# Patient Record
Sex: Male | Born: 1985 | Hispanic: Yes | Marital: Married | Smoking: Never smoker
Health system: Southern US, Community
[De-identification: ages and names within clinical notes are randomized; demographics above are authoritative.]

## PROBLEM LIST (undated history)

## (undated) DIAGNOSIS — Z789 Other specified health status: Secondary | ICD-10-CM

## (undated) HISTORY — PX: NO PAST SURGERIES: SHX2092

## (undated) HISTORY — PX: HERNIA REPAIR: SHX51

---

## 2016-03-02 ENCOUNTER — Emergency Department
Admission: EM | Admit: 2016-03-02 | Discharge: 2016-03-03 | Disposition: A | Discharge status: Home / Self Care | Payer: Self-pay | Attending: Student in an Organized Health Care Education/Training Program | Admitting: Student in an Organized Health Care Education/Training Program

## 2016-03-02 ENCOUNTER — Encounter: Payer: Self-pay | Admitting: Emergency Medicine

## 2016-03-02 DIAGNOSIS — R1033 Periumbilical pain: Secondary | ICD-10-CM | POA: Insufficient documentation

## 2016-03-02 DIAGNOSIS — K429 Umbilical hernia without obstruction or gangrene: Secondary | ICD-10-CM

## 2016-03-02 LAB — COMPREHENSIVE METABOLIC PANEL
ALBUMIN: 4.4 g/dL (ref 3.5–5.0)
ALK PHOS: 52 U/L (ref 38–126)
ALT: 34 U/L (ref 17–63)
ANION GAP: 9 (ref 5–15)
AST: 36 U/L (ref 15–41)
BUN: 14 mg/dL (ref 6–20)
CO2: 23 mmol/L (ref 22–32)
Calcium: 8.8 mg/dL — ABNORMAL LOW (ref 8.9–10.3)
Chloride: 103 mmol/L (ref 101–111)
Creatinine, Ser: 0.9 mg/dL (ref 0.61–1.24)
GFR calc Af Amer: >60 mL/min (ref >60)
GFR calc non Af Amer: >60 mL/min (ref >60)
GLUCOSE: 111 mg/dL — AB (ref 65–99)
POTASSIUM: 3.5 mmol/L (ref 3.5–5.1)
SODIUM: 135 mmol/L (ref 135–145)
TOTAL PROTEIN: 7.7 g/dL (ref 6.5–8.1)
Total Bilirubin: 1.7 mg/dL — ABNORMAL HIGH (ref 0.3–1.2)

## 2016-03-02 LAB — URINALYSIS, COMPLETE (UACMP) WITH MICROSCOPIC
BACTERIA UA: NONE SEEN
Bilirubin Urine: NEGATIVE
Glucose, UA: NEGATIVE mg/dL
HGB URINE DIPSTICK: NEGATIVE
Ketones, ur: NEGATIVE mg/dL
Leukocytes, UA: NEGATIVE
Nitrite: NEGATIVE
PROTEIN: NEGATIVE mg/dL
SPECIFIC GRAVITY, URINE: 1.029 (ref 1.005–1.030)
pH: 5 (ref 5.0–8.0)

## 2016-03-02 LAB — LIPASE, BLOOD: Lipase: 24 U/L (ref 11–51)

## 2016-03-02 LAB — CBC
HEMATOCRIT: 45.8 % (ref 40.0–52.0)
HEMOGLOBIN: 15.7 g/dL (ref 13.0–18.0)
MCH: 29.8 pg (ref 26.0–34.0)
MCHC: 34.2 g/dL (ref 32.0–36.0)
MCV: 87.1 fL (ref 80.0–100.0)
Platelets: 218 10*3/uL (ref 150–440)
RBC: 5.25 MIL/uL (ref 4.40–5.90)
RDW: 13.3 % (ref 11.5–14.5)
WBC: 8.2 10*3/uL (ref 3.8–10.6)

## 2016-03-02 MED ORDER — SODIUM CHLORIDE 0.9 % IV BOLUS (SEPSIS)
1000.0000 mL | Freq: Once | INTRAVENOUS | Status: AC
  Administered 2016-03-03: 1000 mL via INTRAVENOUS

## 2016-03-02 MED ORDER — ONDANSETRON 4 MG PO TBDP
4.0000 mg | ORAL_TABLET | Freq: Once | ORAL | Status: AC | PRN
  Administered 2016-03-02: 4 mg via ORAL

## 2016-03-02 MED ORDER — HYDROMORPHONE HCL 1 MG/ML IJ SOLN
1.0000 mg | INTRAMUSCULAR | Status: DC | PRN
  Administered 2016-03-03: 1 mg via INTRAVENOUS
  Filled 2016-03-02: qty 1

## 2016-03-02 MED ORDER — ONDANSETRON 4 MG PO TBDP
ORAL_TABLET | ORAL | Status: AC
  Filled 2016-03-02: qty 1

## 2016-03-02 NOTE — ED Notes (Signed)
Report to noel, rn.  

## 2016-03-02 NOTE — ED Provider Notes (Signed)
[facility] Emergency Department Provider Note    First MD Initiated Contact with Patient 03/02/16 2258     (approximate)  I have reviewed the triage vital signs and the nursing notes.   HISTORY  Chief Complaint Abdominal Pain    HPI Alexis Walter is a 31 y.o. male who presents with acute periumbilical pain that occurred after the patient was lifting 150 pound box while at work. Patient was straining to lift and felt sudden tearing popping sensation in his umbilicus.  This morning the patient had nausea and one episode of vomiting.  Has had gnawing pain throughout the day. Otherwise healthy. Does not take any blood thinners.   History reviewed. No pertinent past medical history. FMH: no blood thinner History reviewed. No pertinent surgical history. There are no active problems to display for this patient.     Prior to Admission medications   Not on File    Allergies Patient has no known allergies.    Social History Social History  Substance Use Topics  . Smoking status: Never Smoker  . Smokeless tobacco: Never Used  . Alcohol use No    Review of Systems Patient denies headaches, rhinorrhea, blurry vision, numbness, shortness of breath, chest pain, edema, cough, abdominal pain, nausea, vomiting, diarrhea, dysuria, fevers, rashes or hallucinations unless otherwise stated above in HPI. ____________________________________________   PHYSICAL EXAM:  VITAL SIGNS: Vitals:   03/02/16 2137 03/02/16 2248  BP: 133/78 138/72  Pulse: 99 95  Resp:  18  Temp: 98.7 F (37.1 C)     Constitutional: Alert and oriented. Well appearing and in no acute distress. Eyes: Conjunctivae are normal. PERRL. EOMI. Head: Atraumatic. Nose: No congestion/rhinnorhea. Mouth/Throat: Mucous membranes are moist.  Oropharynx non-erythematous. Neck: No stridor. Painless ROM. No cervical spine tenderness to palpation Hematological/Lymphatic/Immunilogical: No  cervical lymphadenopathy. Cardiovascular: Normal rate, regular rhythm. Grossly normal heart sounds.  Good peripheral circulation. Respiratory: Normal respiratory effort.  No retractions. Lungs CTAB. Gastrointestinal: Soft and there is a periumbilical hernia without overlying erythema  Ttp, + audible bowel sounds. No distention. No abdominal bruits. No CVA tenderness.  Musculoskeletal: No lower extremity tenderness nor edema.  No joint effusions. Neurologic:  Normal speech and language. No gross focal neurologic deficits are appreciated. No gait instability. Skin:  Skin is warm, dry and intact. No rash noted. Psychiatric: Mood and affect are normal. Speech and behavior are normal.  ____________________________________________   LABS (all labs ordered are listed, but only abnormal results are displayed)  Results for orders placed or performed during the hospital encounter of 03/02/16 (from the past 24 hour(s))  Lipase, blood     Status: None   Collection Time: 03/02/16  9:47 PM  Result Value Ref Range   Lipase 24 11 - 51 U/L  Comprehensive metabolic panel     Status: Abnormal   Collection Time: 03/02/16  9:47 PM  Result Value Ref Range   Sodium 135 135 - 145 mmol/L   Potassium 3.5 3.5 - 5.1 mmol/L   Chloride 103 101 - 111 mmol/L   CO2 23 22 - 32 mmol/L   Glucose, Bld 111 (H) 65 - 99 mg/dL   BUN 14 6 - 20 mg/dL   Creatinine, Ser 0.860.90 0.61 - 1.24 mg/dL   Calcium 8.8 (L) 8.9 - 10.3 mg/dL   Total Protein 7.7 6.5 - 8.1 g/dL   Albumin 4.4 3.5 - 5.0 g/dL   AST 36 15 - 41 U/L   ALT 34 17 - 63  U/L   Alkaline Phosphatase 52 38 - 126 U/L   Total Bilirubin 1.7 (H) 0.3 - 1.2 mg/dL   GFR calc non Af Amer >60 >60 mL/min   GFR calc Af Amer >60 >60 mL/min   Anion gap 9 5 - 15  CBC     Status: None   Collection Time: 03/02/16  9:47 PM  Result Value Ref Range   WBC 8.2 3.8 - 10.6 K/uL   RBC 5.25 4.40 - 5.90 MIL/uL   Hemoglobin 15.7 13.0 - 18.0 g/dL   HCT 45.4 09.8 - 11.9 %   MCV 87.1 80.0 -  100.0 fL   MCH 29.8 26.0 - 34.0 pg   MCHC 34.2 32.0 - 36.0 g/dL   RDW 14.7 82.9 - 56.2 %   Platelets 218 150 - 440 K/uL  Urinalysis, Complete w Microscopic     Status: Abnormal   Collection Time: 03/02/16  9:47 PM  Result Value Ref Range   Color, Urine YELLOW (A) YELLOW   APPearance CLEAR (A) CLEAR   Specific Gravity, Urine 1.029 1.005 - 1.030   pH 5.0 5.0 - 8.0   Glucose, UA NEGATIVE NEGATIVE mg/dL   Hgb urine dipstick NEGATIVE NEGATIVE   Bilirubin Urine NEGATIVE NEGATIVE   Ketones, ur NEGATIVE NEGATIVE mg/dL   Protein, ur NEGATIVE NEGATIVE mg/dL   Nitrite NEGATIVE NEGATIVE   Leukocytes, UA NEGATIVE NEGATIVE   RBC / HPF 0-5 0 - 5 RBC/hpf   WBC, UA 0-5 0 - 5 WBC/hpf   Bacteria, UA NONE SEEN NONE SEEN   Squamous Epithelial / LPF 0-5 (A) NONE SEEN   Mucous PRESENT    ____________________________________________  ____________________________________________  RADIOLOGY  I personally reviewed all radiographic images ordered to evaluate for the above acute complaints and reviewed radiology reports and findings.  These findings were personally discussed with the patient.  Please see medical record for radiology report.  ____________________________________________   PROCEDURES  Procedure(s) performed:  Procedures    Critical Care performed: no ____________________________________________   INITIAL IMPRESSION / ASSESSMENT AND PLAN / ED COURSE  Pertinent labs & imaging results that were available during my care of the patient were reviewed by me and considered in my medical decision making (see chart for details).  DDX: hernia, perf, obstruction msk strain  Alexis Walter is a 31 y.o. who presents to the ED with Abdominal pain as described above..  Patient is AFVSS in ED. Exam as above. Given current presentation have considered the above differential.  Patient provided pain medication and has evidence of periumbilical hernia. No diffuse peritonitis. I was unable to  successfully reduce the hernia based on his complaint of nausea with vomiting do feel that it is prudent to obtain CT imaging to evaluate for any obstructive process. The patient will be placed on continuous pulse oximetry and telemetry for monitoring.  Laboratory evaluation will be sent to evaluate for the above complaints.     Clinical Course as of Mar 04 343  Sat Mar 03, 2016  0120 Discussed results of CT scan with the patient. Discussed implications of periumbilical hernia and expectant management. We'll provide referral for outpatient surgery clinic as well as symptomatic management. Discussed signs and symptoms for which the patient should return to the ER.  Patient was able to tolerate PO and was able to ambulate with a steady gait.  Have discussed with the patient and available family all diagnostics and treatments performed thus far and all questions were answered to the best of  my ability. The patient demonstrates understanding and agreement with plan.   [PR]    Clinical Course User Index [PR] Willy Eddy, MD     ____________________________________________   FINAL CLINICAL IMPRESSION(S) / ED DIAGNOSES  Final diagnoses:  Periumbilical hernia  Periumbilical abdominal pain      NEW MEDICATIONS STARTED DURING THIS VISIT:  New Prescriptions   No medications on file     Note:  This document was prepared using Dragon voice recognition software and may include unintentional dictation errors.    Willy Eddy, MD 03/03/16 [phone]

## 2016-03-02 NOTE — ED Triage Notes (Signed)
Pt is ambulatory to triage with c/o lower abdominal pain. Pt states that he was lifting paint buckets out of his trunk when he felt some pain. Pt states that the pain really started this morning and that he believes that he has a hernia. Pt has swollen area to umbilicus. Per pt's spouse the area has been there for a while but is swelling more at this time. Pt is in NAD at this time.

## 2016-03-02 NOTE — ED Notes (Signed)
Pt with area noted to umbilicus that is protruding. Pt states began yesterday after lifting heavy 2 gallon paint buckets. Pt states pain does radiate to his back and did have 4 episodes of emesis since pain began. Pt states has had diarrhea but no fever. Pt is tender with periumbilicar pressure.

## 2016-03-03 ENCOUNTER — Emergency Department: Payer: Self-pay

## 2016-03-03 ENCOUNTER — Encounter: Payer: Self-pay | Admitting: Radiology

## 2016-03-03 MED ORDER — IOPAMIDOL (ISOVUE-300) INJECTION 61%
100.0000 mL | Freq: Once | INTRAVENOUS | Status: AC | PRN
  Administered 2016-03-03: 100 mL via INTRAVENOUS

## 2016-03-03 MED ORDER — HYDROCODONE-ACETAMINOPHEN 5-325 MG PO TABS: 1.0000 | ORAL_TABLET | ORAL | 0 refills | Status: DC | PRN

## 2016-03-03 MED ORDER — NAPROXEN 500 MG PO TABS: 500.0000 mg | ORAL_TABLET | Freq: Two times a day (BID) | ORAL | 0 refills | Status: DC

## 2016-03-03 MED ORDER — PROMETHAZINE HCL 12.5 MG PO TABS: 12.5000 mg | ORAL_TABLET | Freq: Four times a day (QID) | ORAL | 0 refills | Status: DC | PRN

## 2016-03-03 MED ORDER — POLYETHYLENE GLYCOL 3350 17 G PO PACK: 17.0000 g | PACK | Freq: Every day | ORAL | 0 refills | Status: DC

## 2016-03-03 NOTE — ED Notes (Signed)
Patient transported to CT 

## 2016-03-12 ENCOUNTER — Encounter: Payer: Self-pay | Admitting: General Surgery

## 2016-03-12 ENCOUNTER — Ambulatory Visit (INDEPENDENT_AMBULATORY_CARE_PROVIDER_SITE_OTHER): Payer: Self-pay | Admitting: General Surgery

## 2016-03-12 VITALS — BP 108/69 | HR 66 | Temp 98.2°F | Ht 65.0 in | Wt 192.0 lb

## 2016-03-12 DIAGNOSIS — K429 Umbilical hernia without obstruction or gangrene: Secondary | ICD-10-CM

## 2016-03-12 NOTE — Progress Notes (Signed)
Patient ID: Alexis Walter, male   DOB: 05/02/1985, 30 y.o.   MRN: [id number]  CC: Umbilical Hernia  HPI Alexis Walter is a 30 y.o. male who presents to clinic for evaluation of an umbilical hernia. Patient reports that he noticed something pop out through the Foley button that was very painful about 10 days ago. Due to the pain he reported to the emergency department for workup and was found to have an umbilical hernia. He was provided with pain medication and it has since improved with pain. He has constipation secondary to pain medication. He had nausea and vomiting prior to going to the ER but none since. He denies any fevers, chills, chest pain, shortness of breath.  HPI  No past medical history on file. No active medications prior to this event  No past surgical history on file. Patient is never had any surgery  No family history on file. Patient denies any family history of cancer, diabetes, heart disease  Social History Social History  Substance Use Topics  . Smoking status: Never Smoker  . Smokeless tobacco: Never Used  . Alcohol use No    No Known Allergies  Current Outpatient Prescriptions  Medication Sig Dispense Refill  . HYDROcodone-acetaminophen (NORCO) 5-325 MG tablet Take 1 tablet by mouth every 4 (four) hours as needed for moderate pain. 6 tablet 0  . naproxen (NAPROSYN) 500 MG tablet Take 1 tablet (500 mg total) by mouth 2 (two) times daily with a meal. 20 tablet 0  . polyethylene glycol (MIRALAX / GLYCOLAX) packet Take 17 g by mouth daily. Mix one tablespoon with 8oz of your favorite juice or water every day until you are having soft formed stools. Then start taking once daily if you didn't have a stool the day before. 30 each 0   No current facility-administered medications for this visit.      Review of Systems A Multi-point review of systems was asked and was negative except for the findings documented in the history of present illness  Physical  Exam Blood pressure 108/69, pulse 66, temperature 98.2 F (36.8 C), temperature source Oral, height 5' 5" (1.651 m), weight 87.1 kg (192 lb). CONSTITUTIONAL: No acute distress. EYES: Pupils are equal, round, and reactive to light, Sclera are non-icteric. EARS, NOSE, MOUTH AND THROAT: The oropharynx is clear. The oral mucosa is pink and moist. Hearing is intact to voice. LYMPH NODES:  Lymph nodes in the neck are normal. RESPIRATORY:  Lungs are clear. There is normal respiratory effort, with equal breath sounds bilaterally, and without pathologic use of accessory muscles. CARDIOVASCULAR: Heart is regular without murmurs, gallops, or rubs. GI: The abdomen is soft, nontender, and nondistended. There is an obvious umbilical hernia that is easily reducible on exam. There is no hepatosplenomegaly. There are normal bowel sounds in all quadrants. GU: Rectal deferred.   MUSCULOSKELETAL: Normal muscle strength and tone. No cyanosis or edema.   SKIN: Turgor is good and there are no pathologic skin lesions or ulcers. NEUROLOGIC: Motor and sensation is grossly normal. Cranial nerves are grossly intact. PSYCH:  Oriented to person, place and time. Affect is normal.  Data Reviewed Recent ER visit labs are all within normal limits, CT scan of the abdomen shows the small periumbilical hernia I have personally reviewed the patient's imaging, laboratory findings and medical records.    Assessment    Umbilical hernia    Plan    30-year-old male with an umbilical hernia. Described the diagnosis as well as   the surgical options in detail to include the risks, benefits, alternatives. The surgery described laparoscopic versus open repair. After this conversation patient elects to proceed with an open hernia repair. The signs and symptoms of incarceration were described in detail after the patient to return to the emergency room should it occur. Otherwise we will plan to proceed with surgery on March 1.     Time  spent with the patient was 45 minutes, with more than 50% of the time spent in face-to-face education, counseling and care coordination.     Shalah Estelle, MD FACS General Surgeon 03/12/2016, 3:50 PM    

## 2016-03-13 ENCOUNTER — Telehealth: Payer: Self-pay

## 2016-03-13 NOTE — Telephone Encounter (Signed)
At this time Patient is self pay and authorization is not necessary for CPT code [id number].

## 2016-03-13 NOTE — Telephone Encounter (Signed)
Patient has been advised of Surgery Date as well as Pre-Admission appointment date, time, and location.  Surgery Date: 03/29/16  Pre-admit Appointment: 03/22/16 at 1030am- Office  Patient has been advised to call [phone] the day before surgery between 1-3pm to obtain arrival time.

## 2016-03-22 ENCOUNTER — Inpatient Hospital Stay: Admission: RE | Admit: 2016-03-22 | Payer: Self-pay | Source: Ambulatory Visit

## 2016-03-22 ENCOUNTER — Telehealth: Payer: Self-pay

## 2016-03-22 NOTE — Telephone Encounter (Signed)
Patient had an appointment no showed to his appointment for pre admit. He is scheduled for an Umbilical Hernia Repair on 03/29/2016 with Dr. Tonita CongWoodham.

## 2016-03-23 ENCOUNTER — Telehealth: Payer: Self-pay

## 2016-03-23 NOTE — Telephone Encounter (Signed)
Rescheduled Pre-admission appointment is: 03/26/16 at 1130am.

## 2016-03-23 NOTE — Telephone Encounter (Signed)
Called patient but his cell phone was not set-up to leave a voicemail. I then called his wife's Byrd Hesselbach(Maria) cell phone and gave her the pre-admit appointment. However, she stated that it was not going to be possible for him to go to his pre-admit appointment. Therefore, I gave Byrd HesselbachMaria pre-admit's number [phone] for her to call and reschedule. Byrd HesselbachMaria understood and had no further questions.

## 2016-03-26 ENCOUNTER — Inpatient Hospital Stay: Admission: RE | Admit: 2016-03-26 | Payer: Self-pay | Source: Ambulatory Visit

## 2016-03-28 ENCOUNTER — Encounter
Admission: RE | Admit: 2016-03-28 | Discharge: 2016-03-28 | Disposition: A | Discharge status: Home / Self Care | Payer: Self-pay | Source: Ambulatory Visit | Attending: General Surgery | Admitting: General Surgery

## 2016-03-28 DIAGNOSIS — Z01818 Encounter for other preprocedural examination: Secondary | ICD-10-CM | POA: Insufficient documentation

## 2016-03-28 HISTORY — DX: Other specified health status: Z78.9

## 2016-03-28 NOTE — Patient Instructions (Addendum)
  Your procedure is scheduled on: March 29, 2016 (Thursday)  (Report to Same Day Surgery 2nd floor medical mall [facility](Medical Mall Entrance-take elevator on left to 2nd floor.  Check in with surgery information desk.) ARRIVAL TIME 6:15 A. M.  Remember: Instructions that are not followed completely may result in serious medical risk, up to and including death, or upon the discretion of your surgeon and anesthesiologist your surgery may need to be rescheduled.    _x___ 1. Do not eat food or drink liquids after midnight. No gum chewing or hard candies.     __x__ 2. No Alcohol for 24 hours before or after surgery.   __x__3. No Smoking for 24 prior to surgery.   ____  4. Bring all medications with you on the day of surgery if instructed.    __x__ 5. Notify your doctor if there is any change in your medical condition     (cold, fever, infections).     Do not wear jewelry, make-up, hairpins, clips or nail polish.  Do not wear lotions, powders, or perfumes. You may wear deodorant.  Do not shave 48 hours prior to surgery. Men may shave face and neck.  Do not bring valuables to the hospital.    [facility] is not responsible for any belongings or valuables.               Contacts, dentures or bridgework may not be worn into surgery.  Leave your suitcase in the car. After surgery it may be brought to your room.  For patients admitted to the hospital, discharge time is determined by your treatment team.   Patients discharged the day of surgery will not be allowed to drive home.  You will need someone to drive you home and stay with you the night of your procedure.    Please read over the following fact sheets that you were given:   [facility] Preparing for Surgery and or MRSA Information   __ Take these medicines the morning of surgery with A SIP OF WATER:    1.   2.  3.  4.  5.  6.  ____Fleets enema or Magnesium Citrate as directed.   _x___ Use CHG Soap or sage wipes as directed on  instruction sheet   ____ Use inhalers on the day of surgery and bring to hospital day of surgery  ____ Stop metformin 2 days prior to surgery    ____ Take 1/2 of usual insulin dose the night before surgery and none on the morning of           surgery.   ____ Stop Aspirin, Coumadin, Pllavix ,Eliquis, Effient, or Pradaxa  x__ Stop Anti-inflammatories such as Advil, Aleve, Ibuprofen, Motrin, Naproxen,          Naprosyn, Goodies powders or aspirin products. Ok to take Tylenol.   ____ Stop supplements until after surgery.    ____ Bring C-Pap to the hospital.

## 2016-03-29 ENCOUNTER — Encounter: Payer: Self-pay | Admitting: Anesthesiology

## 2016-03-29 ENCOUNTER — Ambulatory Visit: Payer: Self-pay | Admitting: Anesthesiology

## 2016-03-29 ENCOUNTER — Encounter
Admission: RE | Disposition: A | Discharge status: Home / Self Care | Payer: Self-pay | Source: Ambulatory Visit | Attending: General Surgery

## 2016-03-29 ENCOUNTER — Ambulatory Visit
Admission: RE | Admit: 2016-03-29 | Discharge: 2016-03-29 | Disposition: A | Discharge status: Home / Self Care | Payer: Self-pay | Source: Ambulatory Visit | Attending: General Surgery | Admitting: General Surgery

## 2016-03-29 DIAGNOSIS — K5900 Constipation, unspecified: Secondary | ICD-10-CM | POA: Insufficient documentation

## 2016-03-29 DIAGNOSIS — K429 Umbilical hernia without obstruction or gangrene: Secondary | ICD-10-CM | POA: Insufficient documentation

## 2016-03-29 HISTORY — PX: UMBILICAL HERNIA REPAIR: SHX196

## 2016-03-29 SURGERY — REPAIR, HERNIA, UMBILICAL, ADULT
Anesthesia: General | Wound class: Clean

## 2016-03-29 MED ORDER — SODIUM CHLORIDE 0.9 % IV SOLN
INTRAVENOUS | Status: DC | PRN
  Administered 2016-03-29: 40 mL

## 2016-03-29 MED ORDER — MIDAZOLAM HCL 2 MG/2ML IJ SOLN
INTRAMUSCULAR | Status: DC | PRN
  Administered 2016-03-29: 2 mg via INTRAVENOUS

## 2016-03-29 MED ORDER — SUCCINYLCHOLINE CHLORIDE 20 MG/ML IJ SOLN
INTRAMUSCULAR | Status: AC
  Filled 2016-03-29: qty 1

## 2016-03-29 MED ORDER — HYDROCODONE-ACETAMINOPHEN 5-325 MG PO TABS
ORAL_TABLET | ORAL | Status: AC
  Filled 2016-03-29: qty 1

## 2016-03-29 MED ORDER — LIDOCAINE HCL (PF) 1 % IJ SOLN
INTRAMUSCULAR | Status: AC
  Filled 2016-03-29: qty 30

## 2016-03-29 MED ORDER — OXYCODONE HCL 5 MG PO TABS: 5.0000 mg | ORAL_TABLET | Freq: Once | ORAL | Status: DC | PRN

## 2016-03-29 MED ORDER — ONDANSETRON HCL 4 MG/2ML IJ SOLN
INTRAMUSCULAR | Status: AC
  Filled 2016-03-29: qty 2

## 2016-03-29 MED ORDER — SUGAMMADEX SODIUM 200 MG/2ML IV SOLN
INTRAVENOUS | Status: AC
  Filled 2016-03-29: qty 2

## 2016-03-29 MED ORDER — SODIUM CHLORIDE 0.9 % IJ SOLN
INTRAMUSCULAR | Status: AC
  Filled 2016-03-29: qty 50

## 2016-03-29 MED ORDER — MIDAZOLAM HCL 2 MG/2ML IJ SOLN
INTRAMUSCULAR | Status: AC
  Filled 2016-03-29: qty 2

## 2016-03-29 MED ORDER — FENTANYL CITRATE (PF) 100 MCG/2ML IJ SOLN
INTRAMUSCULAR | Status: DC | PRN
  Administered 2016-03-29: 50 ug via INTRAVENOUS
  Administered 2016-03-29: 100 ug via INTRAVENOUS
  Administered 2016-03-29: 25 ug via INTRAVENOUS

## 2016-03-29 MED ORDER — FENTANYL CITRATE (PF) 100 MCG/2ML IJ SOLN
25.0000 ug | INTRAMUSCULAR | Status: DC | PRN
  Administered 2016-03-29 (×4): 25 ug via INTRAVENOUS

## 2016-03-29 MED ORDER — CHLORHEXIDINE GLUCONATE CLOTH 2 % EX PADS: 6.0000 | MEDICATED_PAD | Freq: Once | CUTANEOUS | Status: DC

## 2016-03-29 MED ORDER — CEFAZOLIN SODIUM-DEXTROSE 2-4 GM/100ML-% IV SOLN
2.0000 g | INTRAVENOUS | Status: AC
  Administered 2016-03-29: 2 g via INTRAVENOUS

## 2016-03-29 MED ORDER — HYDROCODONE-ACETAMINOPHEN 5-325 MG PO TABS
1.0000 | ORAL_TABLET | ORAL | Status: DC | PRN
  Administered 2016-03-29: 1 via ORAL

## 2016-03-29 MED ORDER — SUCCINYLCHOLINE CHLORIDE 20 MG/ML IJ SOLN
INTRAMUSCULAR | Status: DC | PRN
  Administered 2016-03-29: 100 mg via INTRAVENOUS

## 2016-03-29 MED ORDER — ONDANSETRON HCL 4 MG/2ML IJ SOLN
INTRAMUSCULAR | Status: DC | PRN
  Administered 2016-03-29: 4 mg via INTRAVENOUS

## 2016-03-29 MED ORDER — SUGAMMADEX SODIUM 200 MG/2ML IV SOLN
INTRAVENOUS | Status: DC | PRN
  Administered 2016-03-29: 180 mg via INTRAVENOUS

## 2016-03-29 MED ORDER — FENTANYL CITRATE (PF) 100 MCG/2ML IJ SOLN
INTRAMUSCULAR | Status: AC
  Filled 2016-03-29: qty 2

## 2016-03-29 MED ORDER — ROCURONIUM BROMIDE 50 MG/5ML IV SOLN
INTRAVENOUS | Status: AC
  Filled 2016-03-29: qty 1

## 2016-03-29 MED ORDER — LIDOCAINE HCL (CARDIAC) 20 MG/ML IV SOLN
INTRAVENOUS | Status: DC | PRN
  Administered 2016-03-29: 100 mg via INTRAVENOUS

## 2016-03-29 MED ORDER — OXYCODONE HCL 5 MG/5ML PO SOLN: 5.0000 mg | Freq: Once | ORAL | Status: DC | PRN

## 2016-03-29 MED ORDER — FAMOTIDINE 20 MG PO TABS
ORAL_TABLET | ORAL | Status: AC
  Administered 2016-03-29: 20 mg via ORAL
  Filled 2016-03-29: qty 1

## 2016-03-29 MED ORDER — LIDOCAINE HCL (PF) 1 % IJ SOLN
INTRAMUSCULAR | Status: DC | PRN
  Administered 2016-03-29: 10 mL

## 2016-03-29 MED ORDER — CEFAZOLIN SODIUM-DEXTROSE 2-4 GM/100ML-% IV SOLN
INTRAVENOUS | Status: AC
  Filled 2016-03-29: qty 100

## 2016-03-29 MED ORDER — LACTATED RINGERS IV SOLN
INTRAVENOUS | Status: DC
  Administered 2016-03-29: 07:00:00 via INTRAVENOUS

## 2016-03-29 MED ORDER — BUPIVACAINE LIPOSOME 1.3 % IJ SUSP
INTRAMUSCULAR | Status: AC
  Filled 2016-03-29: qty 20

## 2016-03-29 MED ORDER — PROPOFOL 10 MG/ML IV BOLUS
INTRAVENOUS | Status: AC
  Filled 2016-03-29: qty 20

## 2016-03-29 MED ORDER — HYDROCODONE-ACETAMINOPHEN 5-325 MG PO TABS: 1.0000 | ORAL_TABLET | ORAL | 0 refills | Status: DC | PRN

## 2016-03-29 MED ORDER — ROCURONIUM BROMIDE 100 MG/10ML IV SOLN
INTRAVENOUS | Status: DC | PRN
  Administered 2016-03-29: 5 mg via INTRAVENOUS

## 2016-03-29 MED ORDER — LIDOCAINE HCL (PF) 2 % IJ SOLN
INTRAMUSCULAR | Status: AC
  Filled 2016-03-29: qty 2

## 2016-03-29 MED ORDER — PROPOFOL 10 MG/ML IV BOLUS
INTRAVENOUS | Status: DC | PRN
  Administered 2016-03-29: 170 mg via INTRAVENOUS

## 2016-03-29 MED ORDER — FAMOTIDINE 20 MG PO TABS
20.0000 mg | ORAL_TABLET | Freq: Once | ORAL | Status: AC
  Administered 2016-03-29: 20 mg via ORAL

## 2016-03-29 MED ORDER — FENTANYL CITRATE (PF) 100 MCG/2ML IJ SOLN
INTRAMUSCULAR | Status: AC
  Administered 2016-03-29: 25 ug via INTRAVENOUS
  Filled 2016-03-29: qty 2

## 2016-03-29 SURGICAL SUPPLY — 31 items
BLADE SURG 15 STRL LF DISP TIS (BLADE) ×1 IMPLANT
BLADE SURG 15 STRL SS (BLADE) ×2
CANISTER SUCT 1200ML W/VALVE (MISCELLANEOUS) ×3 IMPLANT
CHLORAPREP W/TINT 26ML (MISCELLANEOUS) ×3 IMPLANT
COTTON BALL STRL MEDIUM (GAUZE/BANDAGES/DRESSINGS) ×3 IMPLANT
DERMABOND ADVANCED (GAUZE/BANDAGES/DRESSINGS) ×2
DERMABOND ADVANCED .7 DNX12 (GAUZE/BANDAGES/DRESSINGS) ×1 IMPLANT
DRAPE LAPAROTOMY 100X77 ABD (DRAPES) ×3 IMPLANT
DRESSING TELFA 4X3 1S ST N-ADH (GAUZE/BANDAGES/DRESSINGS) ×3 IMPLANT
DRSG TEGADERM 4X4.75 (GAUZE/BANDAGES/DRESSINGS) ×3 IMPLANT
ELECT REM PT RETURN 9FT ADLT (ELECTROSURGICAL) ×3
ELECTRODE REM PT RTRN 9FT ADLT (ELECTROSURGICAL) ×1 IMPLANT
GLOVE BIO SURGEON STRL SZ7.5 (GLOVE) ×15 IMPLANT
GLOVE INDICATOR 8.0 STRL GRN (GLOVE) ×3 IMPLANT
GOWN STRL REUS W/ TWL LRG LVL3 (GOWN DISPOSABLE) ×3 IMPLANT
GOWN STRL REUS W/TWL LRG LVL3 (GOWN DISPOSABLE) ×6
KIT RM TURNOVER STRD PROC AR (KITS) ×3 IMPLANT
LABEL OR SOLS (LABEL) ×3 IMPLANT
NEEDLE HYPO 25X1 1.5 SAFETY (NEEDLE) ×6 IMPLANT
NS IRRIG 500ML POUR BTL (IV SOLUTION) ×3 IMPLANT
PACK BASIN MINOR ARMC (MISCELLANEOUS) ×3 IMPLANT
SUT ETHIBOND 0 MO6 C/R (SUTURE) ×3 IMPLANT
SUT MNCRL 4-0 (SUTURE) ×2
SUT MNCRL 4-0 27XMFL (SUTURE) ×1
SUT VIC AB 2-0 CT1 27 (SUTURE) ×2
SUT VIC AB 2-0 CT1 TAPERPNT 27 (SUTURE) ×1 IMPLANT
SUT VIC AB 3-0 SH 27 (SUTURE) ×2
SUT VIC AB 3-0 SH 27X BRD (SUTURE) ×1 IMPLANT
SUTURE MNCRL 4-0 27XMF (SUTURE) ×1 IMPLANT
SYR 20CC LL (SYRINGE) ×3 IMPLANT
SYRINGE 10CC LL (SYRINGE) ×3 IMPLANT

## 2016-03-29 NOTE — Anesthesia Procedure Notes (Signed)
Procedure Name: Intubation Performed by: Ahlivia Salahuddin Pre-anesthesia Checklist: Patient identified, Patient being monitored, Timeout performed, Emergency Drugs available and Suction available Patient Re-evaluated:Patient Re-evaluated prior to inductionOxygen Delivery Method: Circle system utilized Preoxygenation: Pre-oxygenation with 100% oxygen Intubation Type: IV induction Ventilation: Mask ventilation without difficulty Laryngoscope Size: Mac and 3 Grade View: Grade I Tube type: Oral Tube size: 7.5 mm Number of attempts: 1 Airway Equipment and Method: Stylet Placement Confirmation: ETT inserted through vocal cords under direct vision,  positive ETCO2 and breath sounds checked- equal and bilateral Secured at: 22 cm Tube secured with: Tape Dental Injury: Teeth and Oropharynx as per pre-operative assessment        

## 2016-03-29 NOTE — Anesthesia Post-op Follow-up Note (Cosign Needed)
Anesthesia QCDR form completed.        

## 2016-03-29 NOTE — Discharge Instructions (Signed)
Reparacin abierta de una hernia, cuidados posteriores (Open Hernia Repair, Care After) Estas indicaciones le proporcionan informacin acerca de cmo deber cuidarse despus del procedimiento. El mdico tambin podr darle instrucciones especficas. Comunquese con el mdico si tiene algn problema o tiene preguntas despus del procedimiento. CUIDADOS EN EL HOGAR  Mantenga la zona del corte (incisin) limpia y [place]. Puede lavar la zona de la incisin suavemente con agua y jabn 48horas despus de la [place]. Para secar la zona, d palmaditas suaves.  No se d baos de inmersin, no practique natacin ni use el jacuzzi durante 10das o hasta que el mdico lo apruebe.  Cambie el vendaje (apsito) como se lo haya indicado el mdico.  Controle todos los das la zona de la incisin para detectar signos de infeccin. Est atento a lo siguiente:  Dolor, hinchazn o enrojecimiento.  Lquido, sangre o pus.  Coma mucha fruta y verdura fresca. Esto ayuda a Chief Strategy Officer.  Beba suficiente lquido para mantener el pis (orina) claro o de color amarillo plido. Esto tambin ayuda a Banker.  No conduzca ni opere maquinaria pesada hasta que el mdico lo autorice.  No levante ningn objeto que pese ms de 10libras (4,5kg) hasta que el mdico lo apruebe.  No practique deportes de contacto durante cuatro semanas o hasta que el mdico lo apruebe.  Tome los medicamentos solamente como se lo haya indicado el mdico.  Concurra a todas las visitas de control como se lo haya indicado el mdico. Esto es importante. Pregntele al mdico para cundo debe programar una cita para que le retiren los puntos (suturas) o las grapas. SOLICITE AYUDA SI:  La incisin est sangrando ms que antes.  Observa sangre en las heces (materia fecal).  La incisin duele ms que antes.  Tiene enrojecimiento, hinchazn o dolor en la zona de la incisin.  Observa lquido, sangre o pus que  emanan de la incisin.  Tiene fiebre.  Percibe que sale mal olor del rea de la incisin o del vendaje. SOLICITE AYUDA DE INMEDIATO SI:  Tiene una erupcin cutnea.  Le duele el pecho.  Comienza a sentir falta de aire.  Tiene sensacin de desvanecimiento.  Se siente dbil y mareado (sensacin de desvanecimiento). Esta informacin no tiene Theme park manager el consejo del mdico. Asegrese de hacerle al mdico cualquier pregunta que tenga. Document Released: 02/05/2014 Document Revised: 06/29/2015 Document Reviewed: 06/29/2015 Elsevier Interactive Patient Education  2017 Elsevier Inc. Open Hernia Repair, Adult, Care After These instructions give you information about caring for yourself after your procedure. Your doctor may also give you more specific instructions. If you have problems or questions, contact your doctor. Follow these instructions at home: Surgical cut (incision) care    Follow instructions from your doctor about how to take care of your surgical cut area. Make sure you:  Wash your hands with soap and water before you change your bandage (dressing). If you cannot use soap and water, use hand sanitizer.  Change your bandage as told by your doctor. Keep initial bandage in place for 72 hours. Remove and replace with large Band-Aid until there is no drainage on dressing. If cotton sticks to wound, cut the cotton, DO NOT pull glue away from skin.  Leave stitches (sutures), skin glue, or skin tape (adhesive) strips in place. They may need to stay in place for 2 weeks or longer. If tape strips get loose and curl up, you may trim the loose edges. Do not remove tape strips completely unless  your doctor says it is okay.  Check your surgical cut every day for signs of infection. Check for:  More redness, swelling, or pain.  More fluid or blood.  Warmth.  Pus or a bad smell. Activity   Do not drive or use heavy machinery while taking prescription pain medicine. Do not  drive until your doctor says it is okay.  Until your doctor says it is okay:  Do not lift anything that is heavier than 10 lb (4.5 kg).  Do not play contact sports.  Return to your normal activities as told by your doctor. Ask your doctor what activities are safe. General instructions   To prevent or treat having a hard time pooping (constipation) while you are taking prescription pain medicine, your doctor may recommend that you:  Drink enough fluid to keep your pee (urine) clear or pale yellow.  Take over-the-counter or prescription medicines.  Eat foods that are high in fiber, such as fresh fruits and vegetables, whole grains, and beans.  Limit foods that are high in fat and processed sugars, such as fried and sweet foods.  Take over-the-counter and prescription medicines only as told by your doctor.  Do not take baths, swim, or use a hot tub until your doctor says it is okay. OK to shower in 24 hours.  Keep all follow-up visits as told by your doctor. This is important. Contact a doctor if:  You develop a rash.  You have more redness, swelling, or pain around your surgical cut.  You have more fluid or blood coming from your surgical cut.  Your surgical cut feels warm to the touch.  You have pus or a bad smell coming from your surgical cut.  You have a fever or chills.  You have blood in your poop (stool).  You have not pooped in 2-3 days.  Medicine does not help your pain. Get help right away if:  You have chest pain or you are short of breath.  You feel light-headed.  You feel weak and dizzy (feel faint).  You have very bad pain.  You throw up (vomit) and your pain is worse. This information is not intended to replace advice given to you by your health care provider. Make sure you discuss any questions you have with your health care provider. Document Released: 02/05/2014 Document Revised: 08/05/2015 Document Reviewed: 06/29/2015 Elsevier Interactive  Patient Education  2017 [employer]       1.  Las drogas que se Dispensing opticianle administraron permaneceran en su cuerpo [employer]asta [place], asi      que por las proximas 24 horas usted no debe:   Biomedical scientistConducir (manejar) un automovil   Hacer ninguna decision legal   Tomar ninguna bebida alcoholica  2.  A) [place] puede comenzar una dieta regular.  Es mejor que hoy empiece con                    liquidos y gradualmente anada [address].       B) Puede comer cualquier comida que desee pero es mejor empezar con liquidos,               luego sopitas con galletas saladas y gradualmente llegar a las comidas solidas.  3.  Por favor avise a su medico inmediatamente si usted tiene algun sangrado anormal,       tiene dificultad con la respiracion, enrojecimiento y Engineer, miningdolor en el sitio de la [place],     drenaje, fiebro o dolor que  se alivia con medicina.   5.  Istrucciones especificas :

## 2016-03-29 NOTE — Anesthesia Preprocedure Evaluation (Addendum)
Anesthesia Evaluation  Patient identified by MRN, date of birth, ID band Patient awake    Reviewed: Allergy & Precautions, H&P , NPO status , Patient's Chart, lab work & pertinent test results  History of Anesthesia Complications Negative for: history of anesthetic complications  Airway Mallampati: II  TM Distance: >3 FB Neck ROM: full    Dental  (+) Poor Dentition, Chipped   Pulmonary neg pulmonary ROS, neg shortness of breath,    Pulmonary exam normal breath sounds clear to auscultation       Cardiovascular Exercise Tolerance: Good (-) angina(-) Past MI and (-) DOE negative cardio ROS Normal cardiovascular exam Rhythm:regular Rate:Normal     Neuro/Psych negative neurological ROS  negative psych ROS   GI/Hepatic negative GI ROS, Neg liver ROS, neg GERD  ,  Endo/Other  negative endocrine ROS  Renal/GU      Musculoskeletal   Abdominal   Peds  Hematology negative hematology ROS (+)   Anesthesia Other Findings Past Medical History: No date: Medical history non-contributory  Past Surgical History: No date: NO PAST SURGERIES  BMI    Body Mass Index:  29.65 kg/m      Reproductive/Obstetrics negative OB ROS                             Anesthesia Physical Anesthesia Plan  ASA: I  Anesthesia Plan: General ETT   Post-op Pain Management:    Induction:   Airway Management Planned:   Additional Equipment:   Intra-op Plan:   Post-operative Plan:   Informed Consent: I have reviewed the patients History and Physical, chart, labs and discussed the procedure including the risks, benefits and alternatives for the proposed anesthesia with the patient or authorized representative who has indicated his/her understanding and acceptance.   Dental Advisory Given  Plan Discussed with: Anesthesiologist, CRNA and Surgeon  Anesthesia Plan Comments: (Patient declines interpreter )        Anesthesia Quick Evaluation

## 2016-03-29 NOTE — H&P (View-Only) (Signed)
Patient ID: Alexis Walter, male   DOB: 10-31-1985, 31 y.o.   MRN: [id number]  CC: Umbilical Hernia  HPI Alexis Walter is a 31 y.o. male who presents to clinic for evaluation of an umbilical hernia. Patient reports that he noticed something pop out through the Foley button that was very painful about 10 days ago. Due to the pain he reported to the emergency department for workup and was found to have an umbilical hernia. He was provided with pain medication and it has since improved with pain. He has constipation secondary to pain medication. He had nausea and vomiting prior to going to the ER but none since. He denies any fevers, chills, chest pain, shortness of breath.  HPI  No past medical history on file. No active medications prior to this event  No past surgical history on file. Patient is never had any surgery  No family history on file. Patient denies any family history of cancer, diabetes, heart disease  Social History Social History  Substance Use Topics  . Smoking status: Never Smoker  . Smokeless tobacco: Never Used  . Alcohol use No    No Known Allergies  Current Outpatient Prescriptions  Medication Sig Dispense Refill  . HYDROcodone-acetaminophen (NORCO) 5-325 MG tablet Take 1 tablet by mouth every 4 (four) hours as needed for moderate pain. 6 tablet 0  . naproxen (NAPROSYN) 500 MG tablet Take 1 tablet (500 mg total) by mouth 2 (two) times daily with a meal. 20 tablet 0  . polyethylene glycol (MIRALAX / GLYCOLAX) packet Take 17 g by mouth daily. Mix one tablespoon with 8oz of your favorite juice or water every day until you are having soft formed stools. Then start taking once daily if you didn't have a stool the day before. 30 each 0   No current facility-administered medications for this visit.      Review of Systems A Multi-point review of systems was asked and was negative except for the findings documented in the history of present illness  Physical  Exam Blood pressure 108/69, pulse 66, temperature 98.2 F (36.8 C), temperature source Oral, height 5\' 5"  (1.651 m), weight 87.1 kg (192 lb). CONSTITUTIONAL: No acute distress. EYES: Pupils are equal, round, and reactive to light, Sclera are non-icteric. EARS, NOSE, MOUTH AND THROAT: The oropharynx is clear. The oral mucosa is pink and moist. Hearing is intact to voice. LYMPH NODES:  Lymph nodes in the neck are normal. RESPIRATORY:  Lungs are clear. There is normal respiratory effort, with equal breath sounds bilaterally, and without pathologic use of accessory muscles. CARDIOVASCULAR: Heart is regular without murmurs, gallops, or rubs. GI: The abdomen is soft, nontender, and nondistended. There is an obvious umbilical hernia that is easily reducible on exam. There is no hepatosplenomegaly. There are normal bowel sounds in all quadrants. GU: Rectal deferred.   MUSCULOSKELETAL: Normal muscle strength and tone. No cyanosis or edema.   SKIN: Turgor is good and there are no pathologic skin lesions or ulcers. NEUROLOGIC: Motor and sensation is grossly normal. Cranial nerves are grossly intact. PSYCH:  Oriented to person, place and time. Affect is normal.  Data Reviewed Recent ER visit labs are all within normal limits, CT scan of the abdomen shows the small periumbilical hernia I have personally reviewed the patient's imaging, laboratory findings and medical records.    Assessment    Umbilical hernia    Plan    31 year old male with an umbilical hernia. Described the diagnosis as well as  the surgical options in detail to include the risks, benefits, alternatives. The surgery described laparoscopic versus open repair. After this conversation patient elects to proceed with an open hernia repair. The signs and symptoms of incarceration were described in detail after the patient to return to the emergency room should it occur. Otherwise we will plan to proceed with surgery on March 1.     Time  spent with the patient was 45 minutes, with more than 50% of the time spent in face-to-face education, counseling and care coordination.     Alexis Frameharles Walida Cajas, MD FACS General Surgeon 03/12/2016, 3:50 PM

## 2016-03-29 NOTE — Transfer of Care (Signed)
Immediate Anesthesia Transfer of Care Note  Patient: Alexis Walter  Procedure(s) Performed: Procedure(s): HERNIA REPAIR UMBILICAL ADULT (N/A)  Patient Location: PACU  Anesthesia Type:General  Level of Consciousness: sedated  Airway & Oxygen Therapy: Patient Spontanous Breathing and Patient connected to face mask oxygen  Post-op Assessment: Report given to RN and Post -op Vital signs reviewed and stable  Post vital signs: Reviewed and stable  Last Vitals:  Vitals:   03/29/16 0627 03/29/16 0824  BP: 126/75 109/63  Pulse: 63 69  Resp: 16 13  Temp: 36.3 C     Last Pain:  Vitals:   03/29/16 0627  TempSrc: Tympanic         Complications: No apparent anesthesia complications

## 2016-03-29 NOTE — Anesthesia Postprocedure Evaluation (Signed)
Anesthesia Post Note  Patient: Alexis Walter  Procedure(s) Performed: Procedure(s) (LRB): HERNIA REPAIR UMBILICAL ADULT (N/A)  Patient location during evaluation: PACU Anesthesia Type: General Level of consciousness: awake and alert Pain management: pain level controlled Vital Signs Assessment: post-procedure vital signs reviewed and stable Respiratory status: spontaneous breathing, nonlabored ventilation, respiratory function stable and patient connected to nasal cannula oxygen Cardiovascular status: blood pressure returned to baseline and stable Postop Assessment: no signs of nausea or vomiting Anesthetic complications: no     Last Vitals:  Vitals:   03/29/16 0924 03/29/16 0939  BP: (!) 116/99 114/67  Pulse: (!) 59 80  Resp: 12 16  Temp:      Last Pain:  Vitals:   03/29/16 0939  TempSrc:   PainSc: 4                  Cleda MccreedyJoseph K Derreon Consalvo

## 2016-03-29 NOTE — Op Note (Signed)
   Pre-operative Diagnosis: Umbilical hernia  Post-operative Diagnosis: Same  Procedure performed: Open umbilical hernia repair  Surgeon: Ricarda Frameharles Jared Cahn   Assistants: None  Anesthesia: General LMA anesthesia  ASA Class: 1  Surgeon: Ricarda Frameharles Adryanna Friedt, MD FACS  Anesthesia: Gen. with endotracheal tube  Assistant: None  Procedure Details  The patient was seen again in the Holding Room. The benefits, complications, treatment options, and expected outcomes were discussed with the patient. The risks of bleeding, infection, recurrence of symptoms, failure to resolve symptoms,  bowel injury, any of which could require further surgery were reviewed with the patient.   The patient was taken to Operating Room, identified as Alexis Walter and the procedure verified.  A Time Out was held and the above information confirmed.  Prior to the induction of general anesthesia, antibiotic prophylaxis was administered. VTE prophylaxis was in place. General LMA anesthesia was then administered and tolerated well. After the induction, the abdomen was prepped with Chloraprep and draped in the sterile fashion. The patient was positioned in the supine position.  The procedure began by localizing the umbilical region with a 1% lidocaine plain solution. An infraumbilical incision was made with a 15 blade scalpel and using Bovie much cautery was taken down to the level of the anterior rectus fascia. The umbilical stalk was then circumferentially freed up bluntly with a Kelly forceps until it was able to pass completely around the umbilicus. The stalk was then dissected free from the fascia with electrocautery without injury to the skin. A fat containing umbilical hernia was easily identified. However the fat was unable to be returned to the abdomen and it was ligated with electrocautery. The resulting fascial defect was measured to be approximately 7 mm in greatest diameter. Decision was then made to close this  primarily with suture. 0 Ethibond sutures were used to close the defect without any difficulty. A total of 3 sutures were required. There were no other fascial defects identified at this time.  Entire area was copiously irrigated and meticulous hemostasis was insured. The entire operative field was then localized with liposomal bupivacaine. The umbilical stalk was then reattached to the fascia with a 2-0 Vicryl suture. The deep dermal tissue was re-approximate with interrupted 3-0 Vicryl suture. The skin was reapproximated with a running subcuticular 4-0 Monocryl. The skin was then sealed with Dermabond. Once the Dermabond was dry a cotton ball was placed within the umbilicus and Tegaderms placed over this. The negative pressure dressing was created with the local needle.  The procedure was tolerated well. The patient was awoken from LMA anesthesia and transferred to the PACU in good condition. All counts are correct at the end of the procedure and there were no immediate complications.  Findings: 7 mm umbilical hernia   Estimated Blood Loss: 5 L         Drains: None         Specimens: None          Complications: None                  Condition: Good   Ricarda Frameharles Carson Meche, MD, FACS

## 2016-03-29 NOTE — Interval H&P Note (Signed)
History and Physical Interval Note:  03/29/2016 7:03 AM  Alexis Walter  has presented today for surgery, with the diagnosis of PERIUMBILICAL HERNIA  The various methods of treatment have been discussed with the patient and family. After consideration of risks, benefits and other options for treatment, the patient has consented to  Procedure(s): HERNIA REPAIR UMBILICAL ADULT (N/A) as a surgical intervention .  The patient's history has been reviewed, patient examined, no change in status, stable for surgery.  I have reviewed the patient's chart and labs.  Questions were answered to the patient's satisfaction.     Ricarda Frameharles Garreth Burnsworth

## 2016-03-29 NOTE — Brief Op Note (Signed)
03/29/2016  8:18 AM  PATIENT:  Alexis Walter  31 y.o. male  PRE-OPERATIVE DIAGNOSIS:  PERIUMBILICAL HERNIA  POST-OPERATIVE DIAGNOSIS:  PERIUMBILICAL HERNIA  PROCEDURE:  Procedure(s): HERNIA REPAIR UMBILICAL ADULT (N/A)  SURGEON:  Surgeon(s) and Role:    * Ricarda Frame, MD - Primary  PHYSICIAN ASSISTANT:   ASSISTANTS: none   ANESTHESIA:   general  EBL:  No intake/output data recorded.  BLOOD ADMINISTERED:none  DRAINS: none   LOCAL MEDICATIONS USED:  OTHER exparel  SPECIMEN:  No Specimen  DISPOSITION OF SPECIMEN:  N/A  COUNTS:  YES  TOURNIQUET:  * No tourniquets in log *  DICTATION: .Dragon Dictation  PLAN OF CARE: Discharge to home after PACU  PATIENT DISPOSITION:  PACU - hemodynamically stable.   Delay start of Pharmacological VTE agent (>24hrs) due to surgical blood loss or risk of bleeding: not applicable

## 2016-03-30 ENCOUNTER — Encounter: Payer: Self-pay | Admitting: General Surgery

## 2016-04-04 ENCOUNTER — Encounter: Payer: Self-pay | Admitting: General Surgery

## 2016-04-04 ENCOUNTER — Ambulatory Visit (INDEPENDENT_AMBULATORY_CARE_PROVIDER_SITE_OTHER): Payer: Self-pay | Admitting: General Surgery

## 2016-04-04 VITALS — BP 115/75 | HR 68 | Temp 98.3°F | Ht 68.0 in | Wt 188.0 lb

## 2016-04-04 DIAGNOSIS — Z4889 Encounter for other specified surgical aftercare: Secondary | ICD-10-CM

## 2016-04-04 NOTE — Progress Notes (Signed)
Outpatient Surgical Follow Up  04/04/2016  Alexis Walter is an 31 y.o. male.   Chief Complaint  Patient presents with  . Routine Post Op    Umbilical Hernia Repair-03/29/16-Dr.Lacy Taglieri    HPI: 31 year old male returns to clinic for follow-up 1 week status post open umbilical hernia repair. Doing very well. Has not required any pain medications for the last 2 days. He is eating well and having normal bowel function. He denies any fevers, chills, nausea, vomiting, chest pain, shortness of breath.  Past Medical History:  Diagnosis Date  . Medical history non-contributory     Past Surgical History:  Procedure Laterality Date  . NO PAST SURGERIES    . UMBILICAL HERNIA REPAIR N/A 03/29/2016   Procedure: HERNIA REPAIR UMBILICAL ADULT;  Surgeon: Ricarda Frameharles Jahmeek Shirk, MD;  Location: [facility] ORS;  Service: General;  Laterality: N/A;    Family History  Problem Relation Age of Onset  . Diabetes Mother   . Diabetes Father     Social History:  reports that he has never smoked. He has never used smokeless tobacco. He reports that he does not drink alcohol or use drugs.  Allergies: No Known Allergies  Medications reviewed.    ROS A multipoint review of systems was completed. All pertinent positives and negatives are documented within the history of present illness the remainder are negative   BP 115/75   Pulse 68   Temp 98.3 F (36.8 C) (Oral)   Ht 5\' 8"  (1.727 m)   Wt 85.3 kg (188 lb)   BMI 28.59 kg/m   Physical Exam Gen.: No acute distress Chest: Clear to auscultation Heart: Regular rhythm Abdomen: Soft, nontender, nondistended. Well approximated umbilical hernia incision with Dermabond in place. No evidence of infection or recurrence.    No results found for this or any previous visit (from the past 48 hour(s)). No results found.  Assessment/Plan:  1. Aftercare following surgery 31 year old male 1 week status post open umbilical hernia repair. Discussed anticipated return  to normal activities and anticipated length of time for discomfort. Provided with standard postoperative precautions. All questions answered to his satisfaction. A work note was provided and he will follow up with clinic on an as-needed basis.     Ricarda Frameharles Anis Cinelli, MD FACS General Surgeon  04/04/2016,1:34 PM

## 2016-04-04 NOTE — Patient Instructions (Signed)
Please call our office if you have any questions or concerns.  

## 2017-02-24 ENCOUNTER — Emergency Department
Admission: EM | Admit: 2017-02-24 | Discharge: 2017-02-24 | Disposition: A | Discharge status: Home / Self Care | Payer: Self-pay | Attending: Emergency Medicine | Admitting: Emergency Medicine

## 2017-02-24 ENCOUNTER — Encounter: Payer: Self-pay | Admitting: Emergency Medicine

## 2017-02-24 ENCOUNTER — Other Ambulatory Visit: Payer: Self-pay

## 2017-02-24 DIAGNOSIS — T7840XA Allergy, unspecified, initial encounter: Secondary | ICD-10-CM | POA: Insufficient documentation

## 2017-02-24 MED ORDER — HYDROXYZINE PAMOATE 100 MG PO CAPS: 100.0000 mg | ORAL_CAPSULE | Freq: Three times a day (TID) | ORAL | 0 refills | Status: DC | PRN

## 2017-02-24 MED ORDER — EPINEPHRINE 0.3 MG/0.3ML IJ SOAJ
0.3000 mg | Freq: Once | INTRAMUSCULAR | Status: AC
  Administered 2017-02-24: 0.3 mg via INTRAMUSCULAR
  Filled 2017-02-24: qty 0.3

## 2017-02-24 MED ORDER — PREDNISONE 20 MG PO TABS
60.0000 mg | ORAL_TABLET | Freq: Once | ORAL | Status: AC
  Administered 2017-02-24: 60 mg via ORAL
  Filled 2017-02-24: qty 3

## 2017-02-24 MED ORDER — EPINEPHRINE 0.3 MG/0.3ML IJ SOAJ: 0.3000 mg | Freq: Once | INTRAMUSCULAR | 2 refills | Status: AC

## 2017-02-24 MED ORDER — HYDROXYZINE HCL 25 MG PO TABS
100.0000 mg | ORAL_TABLET | Freq: Once | ORAL | Status: AC
  Administered 2017-02-24: 100 mg via ORAL
  Filled 2017-02-24: qty 4

## 2017-02-24 MED ORDER — PREDNISONE 50 MG PO TABS: 50.0000 mg | ORAL_TABLET | Freq: Every day | ORAL | 0 refills | Status: AC

## 2017-02-24 NOTE — Discharge Instructions (Signed)
Please take your steroids as prescribed and make an appointment to follow-up with primary care within the next week for reevaluation.  Return to the emergency department for any concerns such as fevers, chills, chest pain, shortness of breath, or for any other issues whatsoever.  It was a pleasure to take care of you today, and thank you for coming to our emergency department.  If you have any questions or concerns before leaving please ask the nurse to grab me and I'm more than happy to go through your aftercare instructions again.  If you were prescribed any opioid pain medication today such as Norco, Vicodin, Percocet, morphine, hydrocodone, or oxycodone please make sure you do not drive when you are taking this medication as it can alter your ability to drive safely.  If you have any concerns once you are home that you are not improving or are in fact getting worse before you can make it to your follow-up appointment, please do not hesitate to call 911 and come back for further evaluation.  Merrily BrittleNeil Chatara Lucente, MD

## 2017-02-24 NOTE — ED Triage Notes (Addendum)
Pt c/o itching rash all over body since Friday night; taking benadryl and zantac otc-last doses at 6pm last night; pt says the medication was helping at first but not any more; denies difficulty breathing; pt says hands feel swollen; hives to abd

## 2017-02-24 NOTE — ED Provider Notes (Signed)
[facility] Emergency Department Provider Note  ____________________________________________   First MD Initiated Contact with Patient 02/24/17 [id number]     (approximate)  I have reviewed the triage vital signs and the nursing notes.   HISTORY  Chief Complaint Rash   HPI Alexis Walter is a 32 y.o. male self presents the emergency department with 2 days of itchy rash across his chest abdomen and legs.  It is intensely itchy worse at night.  He has been taking Benadryl with some relief.  No shortness of breath.  No cough.  No abdominal pain nausea or vomiting.  No known allergies.  No new exposures.  No one else at home has similar symptoms.  His symptoms began suddenly.  They are constant.  Improved with Benadryl and nothing in particular seems to make it worse.  Past Medical History:  Diagnosis Date  . Medical history non-contributory     Patient Active Problem List   Diagnosis Date Noted  . Umbilical hernia without obstruction and without gangrene 03/12/2016    Past Surgical History:  Procedure Laterality Date  . NO PAST SURGERIES    . UMBILICAL HERNIA REPAIR N/A 03/29/2016   Procedure: HERNIA REPAIR UMBILICAL ADULT;  Surgeon: Ricarda Frameharles Woodham, MD;  Location: [facility] ORS;  Service: General;  Laterality: N/A;    Prior to Admission medications   Medication Sig Start Date End Date Taking? Authorizing Provider  EPINEPHrine (ADRENACLICK) 0.3 mg/0.3 mL IJ SOAJ injection Inject 0.3 mLs (0.3 mg total) into the muscle once for 1 dose. 02/24/17 02/24/17  Merrily Brittleifenbark, Avacyn Kloosterman, MD  hydrOXYzine (VISTARIL) 100 MG capsule Take 1 capsule (100 mg total) by mouth 3 (three) times daily as needed for itching. 02/24/17   Merrily Brittleifenbark, Domique Clapper, MD  predniSONE (DELTASONE) 50 MG tablet Take 1 tablet (50 mg total) by mouth daily for 4 days. 02/24/17 02/28/17  Merrily Brittleifenbark, Kahliya Fraleigh, MD    Allergies Patient has no known allergies.  Family History  Problem Relation Age of Onset  . Diabetes Mother    . Diabetes Father     Social History Social History   Tobacco Use  . Smoking status: Never Smoker  . Smokeless tobacco: Never Used  Substance Use Topics  . Alcohol use: No  . Drug use: No    Review of Systems Constitutional: No fever/chills Eyes: No visual changes. ENT: No sore throat. Cardiovascular: Denies chest pain. Respiratory: Denies shortness of breath. Gastrointestinal: No abdominal pain.  No nausea, no vomiting.  No diarrhea.  No constipation. Genitourinary: Negative for dysuria. Musculoskeletal: Negative for back pain. Skin: Positive for rash. Neurological: Negative for headaches, focal weakness or numbness.   ____________________________________________   PHYSICAL EXAM:  VITAL SIGNS: ED Triage Vitals  Enc Vitals Group     BP 02/24/17 0314 136/85     Pulse Rate 02/24/17 0314 72     Resp 02/24/17 0314 18     Temp 02/24/17 0314 97.8 F (36.6 C)     Temp Source 02/24/17 0314 Oral     SpO2 02/24/17 0314 97 %     Weight 02/24/17 0312 189 lb (85.7 kg)     Height 02/24/17 0312 5\' 2"  (1.575 m)     Head Circumference --      Peak Flow --      Pain Score --      Pain Loc --      Pain Edu? --      Excl. in GC? --     Constitutional: Alert and oriented x4  appears uncomfortable scratching at his skin nontoxic no diaphoresis speaks full clear sentences Eyes: PERRL EOMI. Head: Atraumatic. Nose: No congestion/rhinnorhea. Mouth/Throat: No trismus Neck: No stridor.   Cardiovascular: Normal rate, regular rhythm. Grossly normal heart sounds.  Good peripheral circulation. Respiratory: Normal respiratory effort.  No retractions. Lungs CTAB and moving good air Gastrointestinal: Soft nontender Musculoskeletal: No lower extremity edema   Neurologic:  Normal speech and language. No gross focal neurologic deficits are appreciated. Skin: Urticaria with excoriations across the chest abdomen anterior legs Psychiatric: Mood and affect are normal. Speech and behavior are  normal.    ____________________________________________   DIFFERENTIAL includes but not limited to  Allergic reaction, anaphylaxis ____________________________________________   LABS (all labs ordered are listed, but only abnormal results are displayed)  Labs Reviewed - No data to display   __________________________________________  EKG   ____________________________________________  RADIOLOGY   ____________________________________________   PROCEDURES  Procedure(s) performed: no  Procedures  Critical Care performed: no  Observation: no ____________________________________________   INITIAL IMPRESSION / ASSESSMENT AND PLAN / ED COURSE  Pertinent labs & imaging results that were available during my care of the patient were reviewed by me and considered in my medical decision making (see chart for details).  The patient has clearly resolving urticaria.  I discussed with him the risks and benefits of epinephrine and he would prefer to give a trial which I think is reasonable as he has no cardiovascular risk factors.     ----------------------------------------- 5:36 AM on 02/24/2017 -----------------------------------------  The patient's rash and itching have resolved.  I had a lengthy discussion with the patient and his family at bedside regarding the diagnostic uncertainty of the etiology of his symptoms, however I will refer him to primary care.  He is discharged home in improved condition verbalized understanding and agree with plan. ____________________________________________   FINAL CLINICAL IMPRESSION(S) / ED DIAGNOSES  Final diagnoses:  Allergic reaction, initial encounter      NEW MEDICATIONS STARTED DURING THIS VISIT:  Discharge Medication List as of 02/24/2017  4:36 AM    START taking these medications   Details  EPINEPHrine (ADRENACLICK) 0.3 mg/0.3 mL IJ SOAJ injection Inject 0.3 mLs (0.3 mg total) into the muscle once for 1 dose.,  Starting Sun 02/24/2017, Print    predniSONE (DELTASONE) 50 MG tablet Take 1 tablet (50 mg total) by mouth daily for 4 days., Starting Sun 02/24/2017, Until Thu 02/28/2017, Print         Note:  This document was prepared using Dragon voice recognition software and may include unintentional dictation errors.     Merrily Brittle, MD 02/24/17 2241

## 2018-02-01 ENCOUNTER — Emergency Department
Admission: EM | Admit: 2018-02-01 | Discharge: 2018-02-01 | Disposition: A | Discharge status: Home / Self Care | Payer: Self-pay | Attending: Student in an Organized Health Care Education/Training Program | Admitting: Student in an Organized Health Care Education/Training Program

## 2018-02-01 ENCOUNTER — Other Ambulatory Visit: Payer: Self-pay

## 2018-02-01 ENCOUNTER — Emergency Department: Payer: Self-pay

## 2018-02-01 ENCOUNTER — Encounter: Payer: Self-pay | Admitting: Emergency Medicine

## 2018-02-01 DIAGNOSIS — J111 Influenza due to unidentified influenza virus with other respiratory manifestations: Secondary | ICD-10-CM | POA: Insufficient documentation

## 2018-02-01 DIAGNOSIS — R6889 Other general symptoms and signs: Secondary | ICD-10-CM

## 2018-02-01 DIAGNOSIS — J209 Acute bronchitis, unspecified: Secondary | ICD-10-CM | POA: Insufficient documentation

## 2018-02-01 MED ORDER — AZITHROMYCIN 250 MG PO TABS: ORAL_TABLET | ORAL | 0 refills | Status: DC

## 2018-02-01 MED ORDER — HYDROCODONE-HOMATROPINE 5-1.5 MG/5ML PO SYRP: 5.0000 mL | ORAL_SOLUTION | Freq: Four times a day (QID) | ORAL | 0 refills | Status: AC | PRN

## 2018-02-01 NOTE — ED Notes (Signed)
Cough x 1 week

## 2018-02-01 NOTE — Discharge Instructions (Signed)
Follow-up with your regular doctor if not better in 3 days.  Return emergency department worsening. °

## 2018-02-01 NOTE — ED Provider Notes (Signed)
[facility] Emergency Department Provider Note  ____________________________________________   First MD Initiated Contact with Patient 02/01/18 1540     (approximate)  I have reviewed the triage vital signs and the nursing notes.   HISTORY  Chief Complaint Generalized Body Aches and Cough    HPI Alexis Walter is a 33 y.o. male presents emergency department complaining of cough and congestion with yellow to green mucus.  States he coughs so hard he will throw up.  Complains of body aches.  States he had a fever a week ago.  Symptoms for 10 days.  Denies fever, chills, chest pain or shortness of breath at this time.   Past Medical History:  Diagnosis Date  . Medical history non-contributory     Patient Active Problem List   Diagnosis Date Noted  . Umbilical hernia without obstruction and without gangrene 03/12/2016    Past Surgical History:  Procedure Laterality Date  . NO PAST SURGERIES    . UMBILICAL HERNIA REPAIR N/A 03/29/2016   Procedure: HERNIA REPAIR UMBILICAL ADULT;  Surgeon: Ricarda Frameharles Woodham, MD;  Location: [facility] ORS;  Service: General;  Laterality: N/A;    Prior to Admission medications   Medication Sig Start Date End Date Taking? Authorizing Provider  azithromycin (ZITHROMAX Z-PAK) 250 MG tablet 2 pills today then 1 pill a day for 4 days 02/01/18   Sherrie MustacheFisher, Roselyn BeringSusan W, PA-C  HYDROcodone-homatropine [facility](HYCODAN) 5-1.5 MG/5ML syrup Take 5 mLs by mouth every 6 (six) hours as needed. 02/01/18   Latonya Knight, Roselyn BeringSusan W, PA-C  hydrOXYzine (VISTARIL) 100 MG capsule Take 1 capsule (100 mg total) by mouth 3 (three) times daily as needed for itching. 02/24/17   Merrily Brittleifenbark, Neil, MD    Allergies Patient has no known allergies.  Family History  Problem Relation Age of Onset  . Diabetes Mother   . Diabetes Father     Social History Social History   Tobacco Use  . Smoking status: Never Smoker  . Smokeless tobacco: Never Used  Substance Use Topics  .  Alcohol use: No  . Drug use: No    Review of Systems  Constitutional: No fever/chills Eyes: No visual changes. ENT: No sore throat. Respiratory: Positive cough and congestion, positive wheezing Genitourinary: Negative for dysuria. Musculoskeletal: Negative for back pain. Skin: Negative for rash.    ____________________________________________   PHYSICAL EXAM:  VITAL SIGNS: ED Triage Vitals  Enc Vitals Group     BP 02/01/18 1459 127/85     Pulse Rate 02/01/18 1459 (!) 105     Resp 02/01/18 1459 20     Temp 02/01/18 1459 98 F (36.7 C)     Temp Source 02/01/18 1459 Oral     SpO2 02/01/18 1459 97 %     Weight 02/01/18 1500 195 lb (88.5 kg)     Height 02/01/18 1500 5' 4.96" (1.65 m)     Head Circumference --      Peak Flow --      Pain Score 02/01/18 1500 8     Pain Loc --      Pain Edu? --      Excl. in GC? --     Constitutional: Alert and oriented. Well appearing and in no acute distress. Eyes: Conjunctivae are normal.  Head: Atraumatic. ENT: TMS clear bilaterally Nose: No congestion/rhinnorhea. Mouth/Throat: Mucous membranes are moist.   NECK: Is supple, no lymphadenopathy is noted  cardiovascular: Normal rate, regular rhythm.  Heart sounds are normal Respiratory: Normal respiratory effort.  No  retractions, lungs clear to auscultation.  Cough is dry and hacking and constant. GU: deferred Musculoskeletal: FROM all extremities, warm and well perfused Neurologic:  Normal speech and language.  Skin:  Skin is warm, dry and intact. No rash noted. Psychiatric: Mood and affect are normal. Speech and behavior are normal.  ____________________________________________   LABS (all labs ordered are listed, but only abnormal results are displayed)  Labs Reviewed - No data to display ____________________________________________   ____________________________________________  RADIOLOGY  Chest x-ray is negative for  pneumonia  ____________________________________________   PROCEDURES  Procedure(s) performed: No  Procedures    ____________________________________________   INITIAL IMPRESSION / ASSESSMENT AND PLAN / ED COURSE  Pertinent labs & imaging results that were available during my care of the patient were reviewed by me and considered in my medical decision making (see chart for details).   Patient is 33 year old male presents emergency department complaining of cough and body aches.  Symptoms for over a week.  Physical exam shows a patient that is afebrile.  He is nontoxic.  He is coughing and hacking  Chest x-ray is negative  Explained the test results to the patient.  He was given prescription for Z-Pak and Hycodan cough syrup.  Explained to him that flu testing at this time would be invalid.  He states he understands and will comply with our instructions.  Is given a work note for tomorrow.  Is discharged in stable condition.     As part of my medical decision making, I reviewed the following data within the electronic MEDICAL RECORD NUMBER Nursing notes reviewed and incorporated, Old chart reviewed, Radiograph reviewed chest x-ray is negative for pneumonia, Notes from prior ED visits and [place] Controlled Substance Database  ____________________________________________   FINAL CLINICAL IMPRESSION(S) / ED DIAGNOSES  Final diagnoses:  Acute bronchitis, unspecified organism  Flu-like symptoms      NEW MEDICATIONS STARTED DURING THIS VISIT:  New Prescriptions   AZITHROMYCIN (ZITHROMAX Z-PAK) 250 MG TABLET    2 pills today then 1 pill a day for 4 days   HYDROCODONE-HOMATROPINE (HYCODAN) 5-1.5 MG/5ML SYRUP    Take 5 mLs by mouth every 6 (six) hours as needed.     Note:  This document was prepared using Dragon voice recognition software and may include unintentional dictation errors.     Faythe GheeFisher, Nyquan Selbe W, PA-C 02/01/18 1649    Willy Eddyobinson, Patrick, MD 02/01/18 [phone]

## 2018-02-01 NOTE — ED Triage Notes (Signed)
Pt arrived with complaints of generalized body aches, cough, nasal drainage, and fever that started 1 week prior. Pt reports minimal relief with OTC medication regimen.

## 2018-02-16 ENCOUNTER — Emergency Department: Payer: Self-pay

## 2018-02-16 ENCOUNTER — Inpatient Hospital Stay
Admission: EM | Admit: 2018-02-16 | Discharge: 2018-02-19 | DRG: 872 | Disposition: A | Discharge status: Home / Self Care | Payer: Self-pay | Attending: Internal Medicine | Admitting: Internal Medicine

## 2018-02-16 ENCOUNTER — Inpatient Hospital Stay: Payer: Self-pay

## 2018-02-16 ENCOUNTER — Other Ambulatory Visit: Payer: Self-pay

## 2018-02-16 DIAGNOSIS — A419 Sepsis, unspecified organism: Principal | ICD-10-CM | POA: Diagnosis present

## 2018-02-16 DIAGNOSIS — R739 Hyperglycemia, unspecified: Secondary | ICD-10-CM | POA: Diagnosis present

## 2018-02-16 DIAGNOSIS — R319 Hematuria, unspecified: Secondary | ICD-10-CM

## 2018-02-16 DIAGNOSIS — B9620 Unspecified Escherichia coli [E. coli] as the cause of diseases classified elsewhere: Secondary | ICD-10-CM | POA: Diagnosis present

## 2018-02-16 DIAGNOSIS — E876 Hypokalemia: Secondary | ICD-10-CM | POA: Diagnosis present

## 2018-02-16 DIAGNOSIS — N390 Urinary tract infection, site not specified: Secondary | ICD-10-CM | POA: Diagnosis present

## 2018-02-16 DIAGNOSIS — E871 Hypo-osmolality and hyponatremia: Secondary | ICD-10-CM | POA: Diagnosis present

## 2018-02-16 DIAGNOSIS — Z833 Family history of diabetes mellitus: Secondary | ICD-10-CM

## 2018-02-16 DIAGNOSIS — E860 Dehydration: Secondary | ICD-10-CM | POA: Diagnosis present

## 2018-02-16 DIAGNOSIS — R109 Unspecified abdominal pain: Secondary | ICD-10-CM

## 2018-02-16 LAB — APTT: aPTT: 32 seconds (ref 24–36)

## 2018-02-16 LAB — URINALYSIS, COMPLETE (UACMP) WITH MICROSCOPIC
Bacteria, UA: NONE SEEN
Bilirubin Urine: NEGATIVE
Glucose, UA: NEGATIVE mg/dL
Ketones, ur: 20 mg/dL — AB
Nitrite: NEGATIVE
Protein, ur: NEGATIVE mg/dL
SPECIFIC GRAVITY, URINE: 1.012 (ref 1.005–1.030)
WBC, UA: >50 WBC/hpf — ABNORMAL HIGH (ref 0–5)
pH: 5 (ref 5.0–8.0)

## 2018-02-16 LAB — CBC
HCT: 40.8 % (ref 39.0–52.0)
Hemoglobin: 14.2 g/dL (ref 13.0–17.0)
MCH: 30.3 pg (ref 26.0–34.0)
MCHC: 34.8 g/dL (ref 30.0–36.0)
MCV: 87.2 fL (ref 80.0–100.0)
NRBC: 0 % (ref 0.0–0.2)
Platelets: 290 10*3/uL (ref 150–400)
RBC: 4.68 MIL/uL (ref 4.22–5.81)
RDW: 12.1 % (ref 11.5–15.5)
WBC: 15 10*3/uL — AB (ref 4.0–10.5)

## 2018-02-16 LAB — BASIC METABOLIC PANEL
Anion gap: 8 (ref 5–15)
BUN: 12 mg/dL (ref 6–20)
CHLORIDE: 100 mmol/L (ref 98–111)
CO2: 24 mmol/L (ref 22–32)
Calcium: 8.6 mg/dL — ABNORMAL LOW (ref 8.9–10.3)
Creatinine, Ser: 1.03 mg/dL (ref 0.61–1.24)
GFR calc non Af Amer: >60 mL/min (ref >60)
Glucose, Bld: 142 mg/dL — ABNORMAL HIGH (ref 70–99)
Potassium: 3.3 mmol/L — ABNORMAL LOW (ref 3.5–5.1)
SODIUM: 132 mmol/L — AB (ref 135–145)

## 2018-02-16 LAB — LACTIC ACID, PLASMA
Lactic Acid, Venous: 0.8 mmol/L (ref 0.5–1.9)
Lactic Acid, Venous: 0.8 mmol/L (ref 0.5–1.9)

## 2018-02-16 LAB — PROTIME-INR
INR: 1.1
Prothrombin Time: 14.1 seconds (ref 11.4–15.2)

## 2018-02-16 LAB — MAGNESIUM: Magnesium: 1.7 mg/dL (ref 1.7–2.4)

## 2018-02-16 LAB — TSH: TSH: 0.967 u[IU]/mL (ref 0.350–4.500)

## 2018-02-16 MED ORDER — SODIUM CHLORIDE 0.9 % IV SOLN
INTRAVENOUS | Status: DC
  Administered 2018-02-17 – 2018-02-19 (×6): via INTRAVENOUS

## 2018-02-16 MED ORDER — POLYETHYLENE GLYCOL 3350 17 G PO PACK: 17.0000 g | PACK | Freq: Every day | ORAL | Status: DC | PRN

## 2018-02-16 MED ORDER — ONDANSETRON HCL 4 MG PO TABS: 4.0000 mg | ORAL_TABLET | Freq: Four times a day (QID) | ORAL | Status: DC | PRN

## 2018-02-16 MED ORDER — FENTANYL CITRATE (PF) 100 MCG/2ML IJ SOLN
50.0000 ug | Freq: Once | INTRAMUSCULAR | Status: AC
  Administered 2018-02-16: 50 ug via INTRAVENOUS

## 2018-02-16 MED ORDER — ACETAMINOPHEN 650 MG RE SUPP: 650.0000 mg | Freq: Four times a day (QID) | RECTAL | Status: DC | PRN

## 2018-02-16 MED ORDER — ENOXAPARIN SODIUM 40 MG/0.4ML ~~LOC~~ SOLN
40.0000 mg | SUBCUTANEOUS | Status: DC
  Administered 2018-02-16 – 2018-02-18 (×3): 40 mg via SUBCUTANEOUS
  Filled 2018-02-16 (×3): qty 0.4

## 2018-02-16 MED ORDER — ONDANSETRON HCL 4 MG/2ML IJ SOLN: 4.0000 mg | Freq: Four times a day (QID) | INTRAMUSCULAR | Status: DC | PRN

## 2018-02-16 MED ORDER — FENTANYL CITRATE (PF) 100 MCG/2ML IJ SOLN
INTRAMUSCULAR | Status: AC
  Administered 2018-02-16: 50 ug via INTRAVENOUS
  Filled 2018-02-16: qty 2

## 2018-02-16 MED ORDER — SODIUM CHLORIDE 0.9 % IV BOLUS
1000.0000 mL | Freq: Once | INTRAVENOUS | Status: AC
  Administered 2018-02-16: 1000 mL via INTRAVENOUS

## 2018-02-16 MED ORDER — SODIUM CHLORIDE 0.9 % IV SOLN
1.0000 g | Freq: Once | INTRAVENOUS | Status: AC
  Administered 2018-02-16: 1 g via INTRAVENOUS
  Filled 2018-02-16: qty 10

## 2018-02-16 MED ORDER — POTASSIUM CHLORIDE CRYS ER 20 MEQ PO TBCR
40.0000 meq | EXTENDED_RELEASE_TABLET | Freq: Once | ORAL | Status: AC
  Administered 2018-02-16: 40 meq via ORAL
  Filled 2018-02-16: qty 2

## 2018-02-16 MED ORDER — SODIUM CHLORIDE 0.9 % IV SOLN
1.0000 g | INTRAVENOUS | Status: DC
  Administered 2018-02-17 – 2018-02-18 (×2): 1 g via INTRAVENOUS
  Filled 2018-02-16 (×2): qty 1
  Filled 2018-02-16: qty 10

## 2018-02-16 MED ORDER — ACETAMINOPHEN 325 MG PO TABS
650.0000 mg | ORAL_TABLET | Freq: Four times a day (QID) | ORAL | Status: DC | PRN
  Administered 2018-02-16 – 2018-02-18 (×3): 650 mg via ORAL
  Filled 2018-02-16 (×3): qty 2

## 2018-02-16 NOTE — ED Triage Notes (Signed)
Burning with urination yesterday, fever last night. C/o back pain. A&O, ambulatory. No distress noted.   Spanish interpreter used for triage.

## 2018-02-16 NOTE — ED Notes (Signed)
Patient transported to CT 

## 2018-02-16 NOTE — ED Notes (Signed)
Report accepted by floor. Sepsis protocol initiated by admitting MD. Floor nurse released sepsis orders prior to report. Initial lactic and EKG taken by this RN prior to report and transport of patient to floor.

## 2018-02-16 NOTE — ED Provider Notes (Signed)
[facility] Emergency Department Provider Note    ____________________________________________   I have reviewed the triage vital signs and the nursing notes.   HISTORY  Chief Complaint Right flank pain  History limited by: Not Limited   HPI Alexis Walter is a 33 y.o. male who presents to the emergency department today because of concerns for right back and flank pain.  The patient states the pain started last night prior was in his back initially but now has also been more in his right flank and some suprapubic pain.  He states he noticed that right after he urinated.  Since then every time he urinates he has pain.  He has not noticed any blood in his urine.  He denies similar symptoms in the past.  He did develop fever yesterday as well.  Denies history of kidney stones.  Per medical record review patient has a history of hernia repair.  Past Medical History:  Diagnosis Date  . Medical history non-contributory     Patient Active Problem List   Diagnosis Date Noted  . Umbilical hernia without obstruction and without gangrene 03/12/2016    Past Surgical History:  Procedure Laterality Date  . HERNIA REPAIR    . NO PAST SURGERIES    . UMBILICAL HERNIA REPAIR N/A 03/29/2016   Procedure: HERNIA REPAIR UMBILICAL ADULT;  Surgeon: Ricarda Frameharles Woodham, MD;  Location: [facility] ORS;  Service: General;  Laterality: N/A;    Prior to Admission medications   Medication Sig Start Date End Date Taking? Authorizing Provider  azithromycin (ZITHROMAX Z-PAK) 250 MG tablet 2 pills today then 1 pill a day for 4 days 02/01/18   Sherrie MustacheFisher, Roselyn BeringSusan W, PA-C  HYDROcodone-homatropine [facility](HYCODAN) 5-1.5 MG/5ML syrup Take 5 mLs by mouth every 6 (six) hours as needed. 02/01/18   Fisher, Roselyn BeringSusan W, PA-C  hydrOXYzine (VISTARIL) 100 MG capsule Take 1 capsule (100 mg total) by mouth 3 (three) times daily as needed for itching. 02/24/17   Merrily Brittleifenbark, Neil, MD    Allergies Patient has no known  allergies.  Family History  Problem Relation Age of Onset  . Diabetes Mother   . Diabetes Father     Social History Social History   Tobacco Use  . Smoking status: Never Smoker  . Smokeless tobacco: Never Used  Substance Use Topics  . Alcohol use: Yes  . Drug use: No    Review of Systems Constitutional: No fever/chills Eyes: No visual changes. ENT: No sore throat. Cardiovascular: Denies chest pain. Respiratory: Denies shortness of breath. Gastrointestinal: Positive for abdominal pain.  Genitourinary: Negative for dysuria. Musculoskeletal: Positive for back pain. Skin: Negative for rash. Neurological: Negative for headaches, focal weakness or numbness.  ____________________________________________   PHYSICAL EXAM:  VITAL SIGNS: ED Triage Vitals [02/16/18 1812]  Enc Vitals Group     BP 96/63     Pulse Rate (!) 146     Resp 20     Temp (!) 100.5 F (38.1 C)     Temp Source Oral     SpO2 94 %     Weight 178 lb (80.7 kg)     Height 5\' 5"  (1.651 m)     Head Circumference      Peak Flow      Pain Score 10   Constitutional: Alert and oriented.  Eyes: Conjunctivae are normal.  ENT      Head: Normocephalic and atraumatic.      Nose: No congestion/rhinnorhea.      Mouth/Throat: Mucous membranes are  moist.      Neck: No stridor. Hematological/Lymphatic/Immunilogical: No cervical lymphadenopathy. Cardiovascular: Normal rate, regular rhythm.  No murmurs, rubs, or gallops.  Respiratory: Normal respiratory effort without tachypnea nor retractions. Breath sounds are clear and equal bilaterally. No wheezes/rales/rhonchi. Gastrointestinal: Soft and tender to palpation around the right abdomen.  Positive right CVA tenderness Genitourinary: Deferred Musculoskeletal: Normal range of motion in all extremities. No lower extremity edema. Neurologic:  Normal speech and language. No gross focal neurologic deficits are appreciated.  Skin:  Skin is warm, dry and intact. No rash  noted. Psychiatric: Mood and affect are normal. Speech and behavior are normal. Patient exhibits appropriate insight and judgment.  ____________________________________________    LABS (pertinent positives/negatives)  BMP na 132, k 3.3, glu 142, ca 8.6 UA hazy, moderate hgb dipstick, 20 ketnoes, large leukocytes, 11-20 rbc, >50 wbc CBC wbc 15.0, hgb 14.2, plt 290  ____________________________________________   EKG  None  ____________________________________________    RADIOLOGY  CT abd/pel Normal appendix. No kidney stone. Decompressed bladder with mildly thickened bladder wall.   ____________________________________________   PROCEDURES  Procedures  ____________________________________________   INITIAL IMPRESSION / ASSESSMENT AND PLAN / ED COURSE  Pertinent labs & imaging results that were available during my care of the patient were reviewed by me and considered in my medical decision making (see chart for details).   Patient presented to the emergency department today because of concerns for right flank pain, painful urination.  Patient was noted to be febrile here.  On exam patient appears uncomfortable.  Did have some right sided abdominal tenderness as well as right CVA tenderness.  Blood work did have a mild leukocytosis.  Urine was concerning for urinary tract infection.  There was some red blood cells in it so CT scan was obtained to evaluate for infected stone.  CT scan did not show any kidney stone or ureteral stone.  Patient was given antibiotics.  Will plan on admission.  Discussed findings and plan with patient.   ____________________________________________   FINAL CLINICAL IMPRESSION(S) / ED DIAGNOSES  Final diagnoses:  Right flank pain  Urinary tract infection with hematuria, site unspecified     Note: This dictation was prepared with Dragon dictation. Any transcriptional errors that result from this process are unintentional     Phineas SemenGoodman,  Christana Angelica, MD 02/17/18 [phone]

## 2018-02-16 NOTE — H&P (Addendum)
[employer] [facility] [place] [facility]   PATIENT NAME: Alexis Walter    MR#:  [id number]  DATE OF BIRTH:  Sep 19, 1985  DATE OF ADMISSION:  02/16/2018  [facility]IC[facility]   REQUESTING/REFERRING PHYSICIAN: Phineas SemenGraydon Goodman, MD  CHIEF COMPLAINT:  Low back pain   HISTORY OF PRESENT ILLNESS:  Alexis Walter  is a 33 y.o. male with no past medical history who presented to the ED with left-sided flank pain, dysuria, fevers, and chills that started yesterday morning.  He states that it "burns" every time he pees.  He saw some white discharge in his urine.  He denies any abdominal pain, chest pain, or shortness of breath.  In the ED, was meeting sepsis criteria with HRs in the 140s, fever to 100.79F, and leukocytosis to 15.  Labs were significant for K 3.3.  UA with moderate hemoglobin, 20 ketones, large leukocytes.  CT renal stone study without hydronephrosis or nephrolithiasis, but with mild diffuse bladder wall thickening. He was given ceftriaxone and fluids.  Hospitalists were called for admission.  PAST MEDICAL HISTORY:   Past Medical History:  Diagnosis Date  . Medical history non-contributory     PAST SURGICAL HISTORY:   Past Surgical History:  Procedure Laterality Date  . HERNIA REPAIR    . NO PAST SURGERIES    . UMBILICAL HERNIA REPAIR N/A 03/29/2016   Procedure: HERNIA REPAIR UMBILICAL ADULT;  Surgeon: Ricarda Frameharles Woodham, MD;  Location: [facility] ORS;  Service: General;  Laterality: N/A;    SOCIAL HISTORY:   Social History   Tobacco Use  . Smoking status: Never Smoker  . Smokeless tobacco: Never Used  Substance Use Topics  . Alcohol use: Yes    FAMILY HISTORY:   Family History  Problem Relation Age of Onset  . Diabetes Mother   . Diabetes Father     DRUG ALLERGIES:  No Known Allergies  REVIEW OF SYSTEMS:   Review of Systems  Constitutional: Positive for chills and fever.  HENT: Negative for congestion and  sore throat.   Eyes: Negative for blurred vision and double vision.  Respiratory: Negative for cough and shortness of breath.   Cardiovascular: Negative for chest pain, palpitations and leg swelling.  Gastrointestinal: Negative for abdominal pain, nausea and vomiting.  Genitourinary: Positive for dysuria, flank pain, frequency and urgency. Negative for hematuria.  Musculoskeletal: Positive for back pain. Negative for neck pain.  Neurological: Negative for dizziness and headaches.  Psychiatric/Behavioral: Negative for depression. The patient is not nervous/anxious.     MEDICATIONS AT HOME:   Prior to Admission medications   Medication Sig Start Date End Date Taking? Authorizing Provider  azithromycin (ZITHROMAX Z-PAK) 250 MG tablet 2 pills today then 1 pill a day for 4 days 02/01/18   Sherrie MustacheFisher, Roselyn BeringSusan W, PA-C  HYDROcodone-homatropine [facility](HYCODAN) 5-1.5 MG/5ML syrup Take 5 mLs by mouth every 6 (six) hours as needed. 02/01/18   Fisher, Roselyn BeringSusan W, PA-C  hydrOXYzine (VISTARIL) 100 MG capsule Take 1 capsule (100 mg total) by mouth 3 (three) times daily as needed for itching. 02/24/17   Merrily Brittleifenbark, Neil, MD      VITAL SIGNS:  Blood pressure 96/63, pulse (!) 146, temperature (!) 100.5 F (38.1 C), temperature source Oral, resp. rate 20, height 5\' 5"  (1.651 m), weight 80.7 kg, SpO2 94 %.  PHYSICAL EXAMINATION:  Physical Exam  GENERAL:  33 y.o.-year-old patient lying in the bed with no acute distress.  EYES: Pupils equal, round, reactive to light  and accommodation. No scleral icterus. Extraocular muscles intact. +bilateral conjunctival injection. HEENT: Head atraumatic, normocephalic. Oropharynx and nasopharynx clear.  NECK:  Supple, no jugular venous distention. No thyroid enlargement, no tenderness.  LUNGS: Normal breath sounds bilaterally, no wheezing, rales,rhonchi or crepitation. No use of accessory muscles of respiration.  CARDIOVASCULAR: Tachycardic, regular rhythm, S1, S2 normal. No murmurs, rubs,  or gallops.  BACK: +left CVA tenderness ABDOMEN: Soft, nondistended. Bowel sounds present. No organomegaly or mass. + Suprapubic tenderness to palpation.  No rebound or guarding. EXTREMITIES: No pedal edema, cyanosis, or clubbing.  NEUROLOGIC: Cranial nerves II through XII are intact. Muscle strength 5/5 in all extremities. Sensation intact. Gait not checked.  PSYCHIATRIC: The patient is alert and oriented x 3.  SKIN: No obvious rash, lesion, or ulcer.   LABORATORY PANEL:   CBC Recent Labs  Lab 02/16/18 1844  WBC 15.0*  HGB 14.2  HCT 40.8  PLT 290   ------------------------------------------------------------------------------------------------------------------  Chemistries  Recent Labs  Lab 02/16/18 1844  NA 132*  K 3.3*  CL 100  CO2 24  GLUCOSE 142*  BUN 12  CREATININE 1.03  CALCIUM 8.6*   ------------------------------------------------------------------------------------------------------------------  Cardiac Enzymes No results for input(s): TROPONINI in the last 168 hours. ------------------------------------------------------------------------------------------------------------------  RADIOLOGY:  Ct Renal Stone Study  Result Date: 02/16/2018 CLINICAL DATA:  Burning urination today. EXAM: CT ABDOMEN AND PELVIS WITHOUT CONTRAST TECHNIQUE: Multidetector CT imaging of the abdomen and pelvis was performed following the standard protocol without IV contrast. COMPARISON:  March 03, 2016 FINDINGS: Lower chest: No acute abnormality. Hepatobiliary: Diffuse low density of the liver is identified. No focal liver lesions noted. The gallbladder is normal. The biliary tree is normal. Pancreas: Unremarkable. No pancreatic ductal dilatation or surrounding inflammatory changes. Spleen: Normal in size without focal abnormality. Adrenals/Urinary Tract: The bilateral adrenal glands are normal. The kidneys are normal. No focal renal lesion is identified. Right parapelvic renal cyst is  noted. There is no nephrolithiasis. No hydronephrosis is identified bilaterally. The bladder is partial decompressed with mild diffuse wall thickening. Stomach/Bowel: Stomach is within normal limits. Appendix appears normal. No evidence of bowel wall thickening, distention, or inflammatory changes. Vascular/Lymphatic: No significant vascular findings are present. No enlarged abdominal or pelvic lymph nodes. Reproductive: Prostate is unremarkable. Other: None. Musculoskeletal: No acute abnormality. IMPRESSION: No hydronephrosis or nephrolithiasis bilaterally. The bladder is decompressed with mild diffuse wall thickening. This is nonspecific. Fatty infiltration of liver. No bowel obstruction is noted.  The appendix is normal. Electronically Signed   By: Sherian Rein M.D.   On: 02/16/2018 19:53      IMPRESSION AND PLAN:   Sepsis secondary to UTI- meeting sepsis criteria on admission with tachycardia, leukocytosis, fever.  CT renal stone study without nephrolithiasis, but patient may have had a stone that passed. He does have L sided CVA tenderness, but no perinephric stranding on CT. -Continue ceftriaxone -Follow-up blood and urine cultures (blood culture after first dose of antibiotics) -Check CXR -Check lactic acid -Check urine GC/Chlamydia, given his age and that he noticed some penile discharge -Will give another bolus and continue on IV fluids  Sinus tachycardia- likely due to above -IV fluids -Check TSH -Check EKG -Cardiac monitoring x 24 hours  Hypokalemia- K 3.3 -Replete and recheck -Check mag  Hyponatremia- likely due to dehydration -IVFs -Recheck in the morning  Hyperglycemia- glucose 145 -Check a1c  All the records are reviewed and case discussed with ED provider. Management plans discussed with the patient, family and they are in agreement.  CODE STATUS: Full  TOTAL TIME TAKING CARE OF THIS PATIENT: 45 minutes.    Jinny Blossom Hashem Goynes M.D on 02/16/2018 at 8:07 PM  Between  7am to 6pm - Pager - [phone]  After 6pm go to [email or web address] - Scientist, research (life sciences) [facility]  Office  [phone]  CC: Primary care physician; [facility]   Note: This dictation was prepared with Dragon dictation along with smaller Lobbyist. Any transcriptional errors that result from this process are unintentional.

## 2018-02-17 LAB — HEMOGLOBIN A1C
Hgb A1c MFr Bld: 5.5 % (ref 4.8–5.6)
Mean Plasma Glucose: 111.15 mg/dL

## 2018-02-17 LAB — BASIC METABOLIC PANEL
Anion gap: 6 (ref 5–15)
BUN: 10 mg/dL (ref 6–20)
CHLORIDE: 109 mmol/L (ref 98–111)
CO2: 22 mmol/L (ref 22–32)
Calcium: 7.7 mg/dL — ABNORMAL LOW (ref 8.9–10.3)
Creatinine, Ser: 0.86 mg/dL (ref 0.61–1.24)
GFR calc Af Amer: >60 mL/min (ref >60)
GFR calc non Af Amer: >60 mL/min (ref >60)
Glucose, Bld: 147 mg/dL — ABNORMAL HIGH (ref 70–99)
Potassium: 3.1 mmol/L — ABNORMAL LOW (ref 3.5–5.1)
Sodium: 137 mmol/L (ref 135–145)

## 2018-02-17 LAB — CBC
HEMATOCRIT: 36.7 % — AB (ref 39.0–52.0)
Hemoglobin: 12.6 g/dL — ABNORMAL LOW (ref 13.0–17.0)
MCH: 29.9 pg (ref 26.0–34.0)
MCHC: 34.3 g/dL (ref 30.0–36.0)
MCV: 87.2 fL (ref 80.0–100.0)
Platelets: 243 10*3/uL (ref 150–400)
RBC: 4.21 MIL/uL — ABNORMAL LOW (ref 4.22–5.81)
RDW: 12.1 % (ref 11.5–15.5)
WBC: 13.7 10*3/uL — ABNORMAL HIGH (ref 4.0–10.5)
nRBC: 0 % (ref 0.0–0.2)

## 2018-02-17 LAB — CHLAMYDIA/NGC RT PCR (ARMC ONLY)
Chlamydia Tr: NOT DETECTED
N gonorrhoeae: NOT DETECTED

## 2018-02-17 LAB — PROCALCITONIN: Procalcitonin: 0.74 ng/mL

## 2018-02-17 LAB — LACTIC ACID, PLASMA: Lactic Acid, Venous: 0.8 mmol/L (ref 0.5–1.9)

## 2018-02-17 MED ORDER — IBUPROFEN 400 MG PO TABS
400.0000 mg | ORAL_TABLET | Freq: Four times a day (QID) | ORAL | Status: DC | PRN
  Administered 2018-02-17 – 2018-02-18 (×2): 400 mg via ORAL
  Filled 2018-02-17 (×2): qty 1

## 2018-02-17 MED ORDER — POTASSIUM CHLORIDE CRYS ER 20 MEQ PO TBCR
40.0000 meq | EXTENDED_RELEASE_TABLET | Freq: Once | ORAL | Status: AC
  Administered 2018-02-17: 40 meq via ORAL
  Filled 2018-02-17: qty 2

## 2018-02-17 NOTE — Progress Notes (Signed)
[employer] - [place] at [facility]   PATIENT NAME: Alexis Walter    MR#:  [id number]  DATE OF BIRTH:  07/31/1985  SUBJECTIVE:  CHIEF COMPLAINT:  No chief complaint on file.  No new complaint this morning.  Patient had a maximum temperature of 102.9 last night.  No fevers this morning.  Reports feeling better.  Left flank pains significantly improved    REVIEW OF SYSTEMS:  Review of Systems  Constitutional: Positive for chills and fever. Negative for weight loss.  HENT: Negative for ear pain, hearing loss and tinnitus.   Eyes: Negative for blurred vision and double vision.  Respiratory: Negative for cough, hemoptysis, sputum production and shortness of breath.   Cardiovascular: Negative for chest pain, palpitations and orthopnea.  Gastrointestinal: Negative for heartburn, nausea and vomiting.       Left flank pains.  Improved  Genitourinary: Positive for dysuria. Negative for urgency.  Musculoskeletal: Negative for myalgias and neck pain.  Skin: Negative for itching and rash.  Neurological: Negative for dizziness, tingling and headaches.  Psychiatric/Behavioral: Negative for depression and hallucinations.    DRUG ALLERGIES:  No Known Allergies VITALS:  Blood pressure 102/64, pulse 91, temperature 98 F (36.7 C), temperature source Oral, resp. rate 17, height 5\' 5"  (1.651 m), weight 84 kg, SpO2 96 %. PHYSICAL EXAMINATION:   Physical Exam  Constitutional: He is oriented to person, place, and time and well-developed, well-nourished, and in no distress.  HENT:  Head: Normocephalic and atraumatic.  Eyes: Pupils are equal, round, and reactive to light. Conjunctivae and EOM are normal. Right eye exhibits no discharge.  Neck: Normal range of motion. Neck supple. No thyromegaly present.  Cardiovascular: Normal rate, regular rhythm and normal heart sounds.  Pulmonary/Chest: Effort normal and breath sounds normal. No respiratory distress.  Abdominal: Soft.  Bowel sounds are normal. He exhibits no distension.  Very mild left flank tenderness.  No rebound or guarding.  Musculoskeletal: Normal range of motion.        General: No tenderness or edema.  Neurological: He is alert and oriented to person, place, and time. No cranial nerve deficit.  Skin: Skin is warm. He is not diaphoretic. No erythema.  Psychiatric: Memory, affect and judgment normal.   LABORATORY PANEL:  Male CBC Recent Labs  Lab 02/17/18 0035  WBC 13.7*  HGB 12.6*  HCT 36.7*  PLT 243   ------------------------------------------------------------------------------------------------------------------ Chemistries  Recent Labs  Lab 02/16/18 1844 02/17/18 0035  NA 132* 137  K 3.3* 3.1*  CL 100 109  CO2 24 22  GLUCOSE 142* 147*  BUN 12 10  CREATININE 1.03 0.86  CALCIUM 8.6* 7.7*  MG 1.7  --    RADIOLOGY:  Dg Chest Port 1 View  Result Date: 02/16/2018 CLINICAL DATA:  Burning with urination EXAM: PORTABLE CHEST 1 VIEW COMPARISON:  02/01/2017 FINDINGS: The heart size and mediastinal contours are within normal limits. Both lungs are clear. The visualized skeletal structures are unremarkable. IMPRESSION: No active disease. Electronically Signed   By: Jasmine Pang M.D.   On: 02/16/2018 22:42   Ct Renal Stone Study  Result Date: 02/16/2018 CLINICAL DATA:  Burning urination today. EXAM: CT ABDOMEN AND PELVIS WITHOUT CONTRAST TECHNIQUE: Multidetector CT imaging of the abdomen and pelvis was performed following the standard protocol without IV contrast. COMPARISON:  March 03, 2016 FINDINGS: Lower chest: No acute abnormality. Hepatobiliary: Diffuse low density of the liver is identified. No focal liver lesions noted. The gallbladder is normal. The biliary tree is  normal. Pancreas: Unremarkable. No pancreatic ductal dilatation or surrounding inflammatory changes. Spleen: Normal in size without focal abnormality. Adrenals/Urinary Tract: The bilateral adrenal glands are normal. The  kidneys are normal. No focal renal lesion is identified. Right parapelvic renal cyst is noted. There is no nephrolithiasis. No hydronephrosis is identified bilaterally. The bladder is partial decompressed with mild diffuse wall thickening. Stomach/Bowel: Stomach is within normal limits. Appendix appears normal. No evidence of bowel wall thickening, distention, or inflammatory changes. Vascular/Lymphatic: No significant vascular findings are present. No enlarged abdominal or pelvic lymph nodes. Reproductive: Prostate is unremarkable. Other: None. Musculoskeletal: No acute abnormality. IMPRESSION: No hydronephrosis or nephrolithiasis bilaterally. The bladder is decompressed with mild diffuse wall thickening. This is nonspecific. Fatty infiltration of liver. No bowel obstruction is noted.  The appendix is normal. Electronically Signed   By: Sherian ReinWei-Chen  Lin M.D.   On: 02/16/2018 19:53   ASSESSMENT AND PLAN:   1. Sepsis secondary to UTI- Patient noted to have been tachycardic with leukocytosis and fevers last night. No fevers today.  Tachycardia appears resolved.  Lactic acid level was normal CT renal stone study without nephrolithiasis, but patient may have had a stone that passed.  Left flank tenderness significantly improved.  No perinephric stranding on CT. Patient currently on IV ceftriaxone pending urine culture and blood culture results.  Continue IV fluid hydration Neisseria gonorrhea and Chlamydia not detected on testing Chest x-ray was negative  2. Sinus tachycardia- likely due to sepsis Resolved with IV fluids.  TSH level was normal. Continue telemetry monitoring  3.  Hypokalemia Replaced.  Follow-up on repeat levels in a.m. as well as magnesium level  4. Hyponatremia- likely due to dehydration Resolved with IV fluids with sodium level back to normal  5. Hyperglycemia-  Blood sugars acceptable with recent blood sugar of 147.   Glycosylated hemoglobin level level 5.5   DVT prophylaxis ;  Lovenox     All the records are reviewed and case discussed with Care Management/Social Worker. Management plans discussed with the patient, family and they are in agreement.  CODE STATUS: Full Code  TOTAL TIME TAKING CARE OF THIS PATIENT: 32 minutes.   More than 50% of the time was spent in counseling/coordination of care: YES  POSSIBLE D/C IN 1-2 DAYS, DEPENDING ON CLINICAL CONDITION.   Merilee Wible M.D on 02/17/2018 at 10:46 AM  Between 7am to 6pm - Pager - [phone]  After 6pm go to [email or web address] - Scientist, research (life sciences)password [email or web address] [email or web address]  [facility] [place] [facility]  Office  [phone]  CC: Primary care physician; [facility]  Note: This dictation was prepared with Dragon dictation along with smaller Lobbyistphrase technology. Any transcriptional errors that result from this process are unintentional.

## 2018-02-17 NOTE — Progress Notes (Signed)
Order received to continue telemetry

## 2018-02-18 LAB — BASIC METABOLIC PANEL
Anion gap: 3 — ABNORMAL LOW (ref 5–15)
BUN: 6 mg/dL (ref 6–20)
CHLORIDE: 111 mmol/L (ref 98–111)
CO2: 25 mmol/L (ref 22–32)
Calcium: 8.3 mg/dL — ABNORMAL LOW (ref 8.9–10.3)
Creatinine, Ser: 0.69 mg/dL (ref 0.61–1.24)
GFR calc Af Amer: >60 mL/min (ref >60)
GFR calc non Af Amer: >60 mL/min (ref >60)
Glucose, Bld: 102 mg/dL — ABNORMAL HIGH (ref 70–99)
Potassium: 3.6 mmol/L (ref 3.5–5.1)
Sodium: 139 mmol/L (ref 135–145)

## 2018-02-18 LAB — CBC
HCT: 39.4 % (ref 39.0–52.0)
Hemoglobin: 13 g/dL (ref 13.0–17.0)
MCH: 30.2 pg (ref 26.0–34.0)
MCHC: 33 g/dL (ref 30.0–36.0)
MCV: 91.6 fL (ref 80.0–100.0)
Platelets: 227 10*3/uL (ref 150–400)
RBC: 4.3 MIL/uL (ref 4.22–5.81)
RDW: 12.4 % (ref 11.5–15.5)
WBC: 9.3 10*3/uL (ref 4.0–10.5)
nRBC: 0 % (ref 0.0–0.2)

## 2018-02-18 LAB — HIV ANTIBODY (ROUTINE TESTING W REFLEX): HIV Screen 4th Generation wRfx: NONREACTIVE

## 2018-02-18 LAB — MAGNESIUM: Magnesium: 2.1 mg/dL (ref 1.7–2.4)

## 2018-02-18 NOTE — Progress Notes (Signed)
[employer] - [place] at [facility]   PATIENT NAME: Alexis Walter    MR#:  [id number]  DATE OF BIRTH:  03/08/1985  SUBJECTIVE:  CHIEF COMPLAINT:  No chief complaint on file.  No new complaint this morning.  Patient had a maximum temperature of 102.9 last night.  No fevers this morning.  Reports feeling better.  Left flank pains significantly improved    REVIEW OF SYSTEMS:  Review of Systems  Constitutional: Negative for chills, fever and weight loss.  HENT: Negative for ear pain, hearing loss and tinnitus.   Eyes: Negative for blurred vision and double vision.  Respiratory: Negative for cough, hemoptysis, sputum production and shortness of breath.   Cardiovascular: Negative for chest pain, palpitations and orthopnea.  Gastrointestinal: Negative for heartburn, nausea and vomiting.       Left flank pains.  Improved  Genitourinary: Negative for dysuria and urgency.  Musculoskeletal: Negative for myalgias and neck pain.  Skin: Negative for itching and rash.  Neurological: Negative for dizziness, tingling and headaches.  Psychiatric/Behavioral: Negative for depression and hallucinations.    DRUG ALLERGIES:  No Known Allergies VITALS:  Blood pressure 115/79, pulse 100, temperature 98.9 F (37.2 C), temperature source Oral, resp. rate 16, height 5\' 5"  (1.651 m), weight 84 kg, SpO2 99 %. PHYSICAL EXAMINATION:   Physical Exam  Constitutional: He is oriented to person, place, and time and well-developed, well-nourished, and in no distress.  HENT:  Head: Normocephalic and atraumatic.  Eyes: Pupils are equal, round, and reactive to light. Conjunctivae and EOM are normal. Right eye exhibits no discharge.  Neck: Normal range of motion. Neck supple. No thyromegaly present.  Cardiovascular: Normal rate, regular rhythm and normal heart sounds.  Pulmonary/Chest: Effort normal and breath sounds normal. No respiratory distress.  Abdominal: Soft. Bowel sounds are  normal. He exhibits no distension.  Very mild left flank tenderness.  No rebound or guarding.  Musculoskeletal: Normal range of motion.        General: No tenderness or edema.  Neurological: He is alert and oriented to person, place, and time. No cranial nerve deficit.  Skin: Skin is warm. He is not diaphoretic. No erythema.  Psychiatric: Memory, affect and judgment normal.   LABORATORY PANEL:  Male CBC Recent Labs  Lab 02/18/18 0442  WBC 9.3  HGB 13.0  HCT 39.4  PLT 227   ------------------------------------------------------------------------------------------------------------------ Chemistries  Recent Labs  Lab 02/18/18 0442  NA 139  K 3.6  CL 111  CO2 25  GLUCOSE 102*  BUN 6  CREATININE 0.69  CALCIUM 8.3*  MG 2.1   RADIOLOGY:  No results found. ASSESSMENT AND PLAN:   1. Sepsis secondary to UTI- Patient noted to have been tachycardic with leukocytosis and fevers  Patient has remained afebrile for more than 24 hours now.  Tachycardia appears resolved.  Lactic acid level was normal CT renal stone study without nephrolithiasis, but patient may have had a stone that passed.  Left flank tenderness significantly improved.  No perinephric stranding on CT. Patient currently on IV ceftriaxone.  Urine culture growing E. coli.  Sensitivities still not available.  Anticipate discharge on oral antibiotics in a.m. once sensitivities available.  Continue IV fluid hydration. Neisseria gonorrhea and Chlamydia not detected on testing Chest x-ray was negative  2. Sinus tachycardia- likely due to sepsis Resolved with IV fluids.  TSH level was normal. Continue telemetry monitoring  3.  Hypokalemia Replaced.  4. Hyponatremia- likely due to dehydration Resolved with IV fluids with  sodium level back to normal  5. Hyperglycemia-  Blood sugars acceptable with recent blood sugar of 102 Glycosylated hemoglobin level level 5.5   DVT prophylaxis ; Lovenox     All the records are  reviewed and case discussed with Care Management/Social Worker. Management plans discussed with the patient, family and they are in agreement.  CODE STATUS: Full Code  TOTAL TIME TAKING CARE OF THIS PATIENT: 31 minutes.   More than 50% of the time was spent in counseling/coordination of care: YES  POSSIBLE D/C IN 1DAY, DEPENDING ON CLINICAL CONDITION.   Cuyler Vandyken M.D on 02/18/2018 at 11:43 AM  Between 7am to 6pm - Pager - [phone]  After 6pm go to [email or web address] - Scientist, research (life sciences)password EPAS [facility]  [facility] [place] [facility]  Office  [phone]  CC: Primary care physician; [facility]  Note: This dictation was prepared with Dragon dictation along with smaller Lobbyistphrase technology. Any transcriptional errors that result from this process are unintentional.

## 2018-02-19 LAB — URINE CULTURE: Culture: 20000 — AB

## 2018-02-19 MED ORDER — CEPHALEXIN 500 MG PO CAPS: 500.0000 mg | ORAL_CAPSULE | Freq: Two times a day (BID) | ORAL | 0 refills | Status: AC

## 2018-02-19 NOTE — Discharge Summary (Signed)
[employer] - [place] at [facility]   PATIENT NAME: Alexis Walter    MR#:  [id number]  DATE OF BIRTH:  1986-01-16  DATE OF ADMISSION:  02/16/2018   ADMITTING PHYSICIAN: Campbell StallKaty Dodd Mayo, MD  DATE OF DISCHARGE: 02/19/2018  PRIMARY CARE PHYSICIAN: [facility]   ADMISSION DIAGNOSIS:  Right flank pain [R10.9] Sepsis (HCC) [A41.9] Urinary tract infection with hematuria, site unspecified [N39.0, R31.9] DISCHARGE DIAGNOSIS:  Active Problems:   Sepsis (HCC)  SECONDARY DIAGNOSIS:   Past Medical History:  Diagnosis Date  . Medical history non-contributory    HOSPITAL COURSE:  Chief complaint;  flank pains  HPI Alexis Walter  is a 33 y.o. male with no past medical history who presented to the ED with left-sided flank pain, dysuria, fevers, and chills that started 1 day prior to admission.Marland Kitchen.  He states that it "burns" every time he pees.  He saw some white discharge in his urine.  He denies any abdominal pain, chest pain, or shortness of breath.In the ED, was meeting sepsis criteria with HRs in the 140s, fever to 100.79F, and leukocytosis to 15.  Labs were significant for K 3.3.  UA with moderate hemoglobin, 20 ketones, large leukocytes.  CT renal stone study without hydronephrosis or nephrolithiasis, but with mild diffuse bladder wall thickening. He was given ceftriaxone and fluids.  Patient admitted to medical service for further evaluation.  Please refer to the H&P dictated for further details.   HOSPITAL COURSE; 1. Sepsis secondary to UTI- Patient noted to have been tachycardic with leukocytosis and fevers .  Patient has remained afebrile for at least 48 hours.  Tachycardia resolved.  Lactic acid level was normal.  Flank pain is completely resolved.  Was treated with IV antibiotics with Rocephin.  Urine culture came back with E. coli sensitive to Rocephin and cephalosporins.  Adequately hydrated with IV fluids clinically and hemodynamically  stable.  Being discharged on p.o. Keflex to complete a minimum of total of 7-day treatment duration with antibiotics.  Follow-up with primary care physician. CT renal stone study without done during this admission revealed nephrolithiasis, but patient may have had a stone that passed.  Left flank tenderness significantly improved.  No perinephric stranding on CT. gonorrhea and Chlamydia not detected on testing. Chest x-ray was negative  2. Sinus tachycardia-likely due to sepsis Resolved with IV fluids.  TSH level was normal.  3.  Hypokalemia Replaced.  4. Hyponatremia-likely due to dehydration Resolved with IV fluids with sodium level back to normal  5. Hyperglycemia-  Blood sugars acceptable with recent blood sugar of 102 Glycosylated hemoglobin level level 5.5    DISCHARGE CONDITIONS:  Stable CONSULTS OBTAINED:   DRUG ALLERGIES:  No Known Allergies DISCHARGE MEDICATIONS:   Allergies as of 02/19/2018   No Known Allergies     Medication List    STOP taking these medications   azithromycin 250 MG tablet Commonly known as:  ZITHROMAX Z-PAK     TAKE these medications   cephALEXin 500 MG capsule Commonly known as:  KEFLEX Take 1 capsule (500 mg total) by mouth 2 (two) times daily for 4 days.   HYDROcodone-homatropine 5-1.5 MG/5ML syrup Commonly known as:  HYCODAN Take 5 mLs by mouth every 6 (six) hours as needed.        DISCHARGE INSTRUCTIONS:   DIET:  Regular diet DISCHARGE CONDITION:  Stable ACTIVITY:  Activity as tolerated OXYGEN:  Home Oxygen: No.  Oxygen Delivery: room air DISCHARGE LOCATION:  home  If you experience worsening of your admission symptoms, develop shortness of breath, life threatening emergency, suicidal or homicidal thoughts you must seek medical attention immediately by calling 911 or calling your MD immediately  if symptoms less severe.  You Must read complete instructions/literature along with all the possible adverse  reactions/side effects for all the Medicines you take and that have been prescribed to you. Take any new Medicines after you have completely understood and accpet all the possible adverse reactions/side effects.   Please note  You were cared for by a hospitalist during your hospital stay. If you have any questions about your discharge medications or the care you received while you were in the hospital after you are discharged, you can call the unit and asked to speak with the hospitalist on call if the hospitalist that took care of you is not available. Once you are discharged, your primary care physician will handle any further medical issues. Please note that NO REFILLS for any discharge medications will be authorized once you are discharged, as it is imperative that you return to your primary care physician (or establish a relationship with a primary care physician if you do not have one) for your aftercare needs so that they can reassess your need for medications and monitor your lab values.    On the day of Discharge:  VITAL SIGNS:  Blood pressure 118/71, pulse 91, temperature 99.8 F (37.7 C), temperature source Oral, resp. rate 18, height 5\' 5"  (1.651 m), weight 84 kg, SpO2 97 %. PHYSICAL EXAMINATION:  GENERAL:  34 y.o.-year-old patient lying in the bed with no acute distress.  EYES: Pupils equal, round, reactive to light and accommodation. No scleral icterus. Extraocular muscles intact.  HEENT: Head atraumatic, normocephalic. Oropharynx and nasopharynx clear.  NECK:  Supple, no jugular venous distention. No thyroid enlargement, no tenderness.  LUNGS: Normal breath sounds bilaterally, no wheezing, rales,rhonchi or crepitation. No use of accessory muscles of respiration.  CARDIOVASCULAR: S1, S2 normal. No murmurs, rubs, or gallops.  ABDOMEN: Soft, non-tender, non-distended. Bowel sounds present. No organomegaly or mass.  EXTREMITIES: No pedal edema, cyanosis, or clubbing.  NEUROLOGIC:  Cranial nerves II through XII are intact. Muscle strength 5/5 in all extremities. Sensation intact. Gait not checked.  PSYCHIATRIC: The patient is alert and oriented x 3.  SKIN: No obvious rash, lesion, or ulcer.  DATA REVIEW:   CBC Recent Labs  Lab 02/18/18 0442  WBC 9.3  HGB 13.0  HCT 39.4  PLT 227    Chemistries  Recent Labs  Lab 02/18/18 0442  NA 139  K 3.6  CL 111  CO2 25  GLUCOSE 102*  BUN 6  CREATININE 0.69  CALCIUM 8.3*  MG 2.1     Microbiology Results  Results for orders placed or performed during the hospital encounter of 02/16/18  Urine Culture     Status: Abnormal   Collection Time: 02/16/18  6:44 PM  Result Value Ref Range Status   Specimen Description   Final    URINE, RANDOM Performed at [facility], [address], [place], [place] [address]    Special Requests   Final    NONE Performed at [facility], [address] [place], [place] [address]    Culture 20,000 COLONIES/mL ESCHERICHIA COLI (A)  Final   Report Status 02/19/2018 FINAL  Final   Organism ID, Bacteria ESCHERICHIA COLI (A)  Final      Susceptibility   Escherichia coli - MIC*    AMPICILLIN >=  32 RESISTANT Resistant     CEFAZOLIN <=4 SENSITIVE Sensitive     CEFTRIAXONE <=1 SENSITIVE Sensitive     CIPROFLOXACIN <=0.25 SENSITIVE Sensitive     GENTAMICIN <=1 SENSITIVE Sensitive     IMIPENEM <=0.25 SENSITIVE Sensitive     NITROFURANTOIN <=16 SENSITIVE Sensitive     TRIMETH/SULFA >=320 RESISTANT Resistant     AMPICILLIN/SULBACTAM 16 INTERMEDIATE Intermediate     PIP/TAZO <=4 SENSITIVE Sensitive     Extended ESBL NEGATIVE Sensitive     * 20,000 COLONIES/mL ESCHERICHIA COLI  Chlamydia/NGC rt PCR ([facility] only)     Status: None   Collection Time: 02/16/18  6:44 PM  Result Value Ref Range Status   Specimen source GC/Chlam URINE, RANDOM  Final   Chlamydia Tr NOT DETECTED NOT DETECTED Final   N gonorrhoeae NOT DETECTED NOT DETECTED Final    Comment: (NOTE) This CT/NG  assay has not been evaluated in patients with a history of  hysterectomy. Performed at [facility], [address] [place], [place] [address]   Culture, blood (x 2)     Status: None (Preliminary result)   Collection Time: 02/16/18 10:50 PM  Result Value Ref Range Status   Specimen Description BLOOD RIGHT HAND  Final   Special Requests   Final    BOTTLES DRAWN AEROBIC AND ANAEROBIC Blood Culture adequate volume   Culture   Final    NO GROWTH 3 DAYS Performed at [facility], [address], [place], [place] [address]    Report Status PENDING  Incomplete  Culture, blood (x 2)     Status: None (Preliminary result)   Collection Time: 02/16/18 10:59 PM  Result Value Ref Range Status   Specimen Description BLOOD LEFT ANTECUBITAL  Final   Special Requests   Final    BOTTLES DRAWN AEROBIC AND ANAEROBIC Blood Culture adequate volume   Culture   Final    NO GROWTH 3 DAYS Performed at [facility], [address] [place], [place] [address]    Report Status PENDING  Incomplete    RADIOLOGY:  No results found.   Management plans discussed with the patient, family and they are in agreement.  CODE STATUS: Full Code   TOTAL TIME TAKING CARE OF THIS PATIENT: 35 minutes.    Deandra Gadson M.D on 02/19/2018 at 10:03 AM  Between 7am to 6pm - Pager - [phone]  After 6pm go to [email or web address] - Scientist, research (life sciences) [place] [facility]  Office  [phone]  CC: Primary care physician; [facility]   Note: This dictation was prepared with Dragon dictation along with smaller Lobbyist. Any transcriptional errors that result from this process are unintentional.

## 2018-02-19 NOTE — Progress Notes (Signed)
Alexis Walter  A and O x 4. VSS. Pt tolerating diet well. No complaints of pain or nausea. IV removed intact, prescriptions given. Pt voiced understanding of discharge instructions with no further questions. Pt discharged home.   Allergies as of 02/19/2018   No Known Allergies     Medication List    STOP taking these medications   azithromycin 250 MG tablet Commonly known as:  ZITHROMAX Z-PAK     TAKE these medications   cephALEXin 500 MG capsule Commonly known as:  KEFLEX Take 1 capsule (500 mg total) by mouth 2 (two) times daily for 4 days.   HYDROcodone-homatropine 5-1.5 MG/5ML syrup Commonly known as:  HYCODAN Take 5 mLs by mouth every 6 (six) hours as needed.       Vitals:   02/18/18 2114 02/19/18 0509  BP: 119/64 118/71  Pulse: 67 91  Resp: 18 18  Temp: 98.1 F (36.7 C) 99.8 F (37.7 C)  SpO2: 98% 97%    Alexis Walter

## 2018-02-19 NOTE — Progress Notes (Signed)
Per MD okay for RN to DC telemetry order. 

## 2018-02-19 NOTE — Care Management Note (Signed)
Case Management Note  Patient Details  Name: Alexis Walter MRN: [id number] Date of Birth: 1985/04/30   Patient to discharge today. Patient has follow up scheduled with PCP at [facility] on 1/27. Patient provided goodrx coupon for [facility].  Out of pocket cost for keflex $7.50  Subjective/Objective:                    Action/Plan:   Expected Discharge Date:  02/19/18               Expected Discharge Plan:  Home/Self Care  In-House Referral:     Discharge planning Services  CM Consult, Indigent Health Clinic, Medication Assistance  Post Acute Care Choice:    Choice offered to:     DME Arranged:    DME Agency:     HH Arranged:    HH Agency:     Status of Service:  Completed, signed off  If discussed at [employer], dates discussed:    Additional Comments:  Chapman Fitch, RN 02/19/2018, 11:22 AM

## 2018-02-21 LAB — CULTURE, BLOOD (ROUTINE X 2)
Culture: NO GROWTH
Culture: NO GROWTH
Special Requests: ADEQUATE
Special Requests: ADEQUATE

## 2019-05-02 ENCOUNTER — Ambulatory Visit: Payer: Self-pay | Attending: Internal Medicine

## 2019-05-02 DIAGNOSIS — Z23 Encounter for immunization: Secondary | ICD-10-CM

## 2019-05-02 NOTE — Progress Notes (Signed)
   Covid-19 Vaccination Clinic  Name:  Alexis Walter    MRN: [id number] DOB: Nov 12, 1985  05/02/2019  Mr. [name] was observed post Covid-19 immunization for 15 minutes without incident. He was provided with Vaccine Information Sheet and instruction to access the V-Safe system. Medical Interpreter used.  Mr. [name] was instructed to call 911 with any severe reactions post vaccine: Marland Kitchen Difficulty breathing  . Swelling of face and throat  . A fast heartbeat  . A bad rash all over body  . Dizziness and weakness   Immunizations Administered    Name Date Dose VIS Date Route   Pfizer COVID-19 Vaccine 05/02/2019  3:21 PM 0.3 mL 01/09/2019 Intramuscular   Manufacturer: [employer], [employer]   Lot: [id number]   NDC: [address]-9

## 2019-05-23 ENCOUNTER — Ambulatory Visit: Payer: Self-pay | Attending: Internal Medicine

## 2019-05-23 DIAGNOSIS — Z23 Encounter for immunization: Secondary | ICD-10-CM

## 2019-05-23 NOTE — Progress Notes (Signed)
   Cov[facility] Vaccination [facility]  Name:  Alexis Walter    MRN: [id number] DOB: [birth year]  05/23/2019  Alexis Walter was observed post Covid-19 immunization for 15 minutes without incident. He was provided with Vaccine Information Sheet and instruction to access the V-Safe system.   Alexis Walter was instructed to call 911 with any severe reactions post vaccine: Marland Kitchen Difficulty breathing  . Swelling of face and throat  . A fast heartbeat  . A bad rash all over body  . Dizziness and weakness   Immunizations Administered    Name Date Dose VIS Date Route   Pfizer COVID-19 Vaccine 05/23/2019  5:04 PM 0.3 mL 03/25/2018 Intramuscular   Manufacturer: [employer], [employer]   Lot: [id number]   NDC: [address]-5

## 2019-08-23 IMAGING — CT CT RENAL STONE PROTOCOL
3 of 4 series · 9 of 46 positions shown, 16 images · non-contrast
Comparison: March 03, 2016

CLINICAL DATA: Burning urination today.

EXAM:
CT ABDOMEN AND PELVIS WITHOUT CONTRAST
TECHNIQUE: Multidetector CT imaging of the abdomen and pelvis was performed
following the standard protocol without IV contrast.

[Series 4: lung bases · axial · 0.75mm/px · z∈[-570,-480]mm · 5 of 28 slices shown, 10 images]
[im 5/28  soft-tissue]
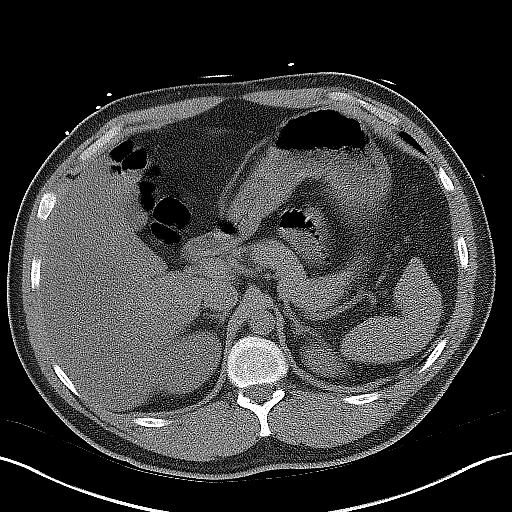
[im 5/28  bone]
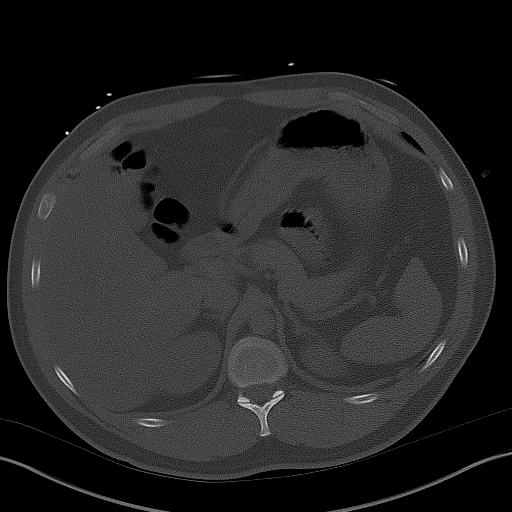
[im 10/28  soft-tissue]
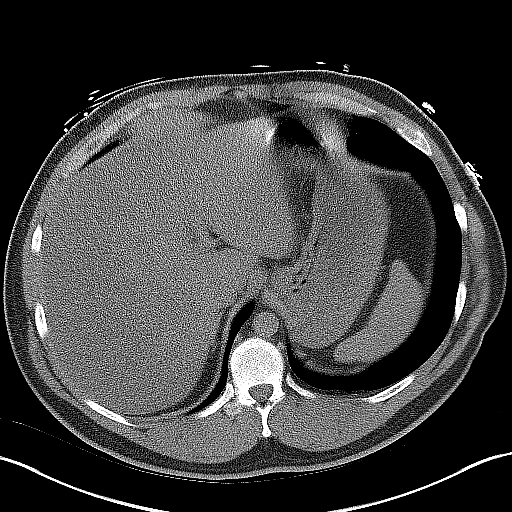
[im 10/28  lung]
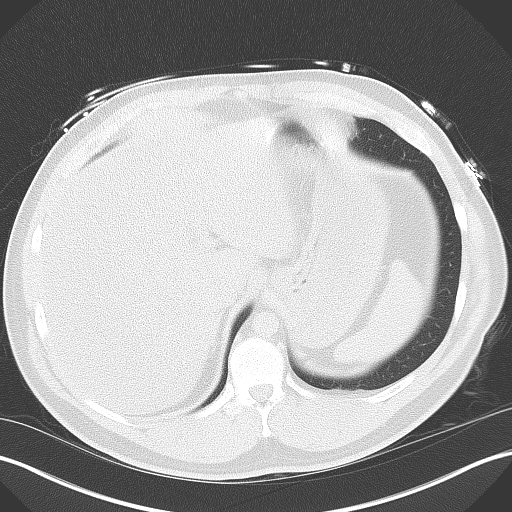
[im 14/28  soft-tissue]
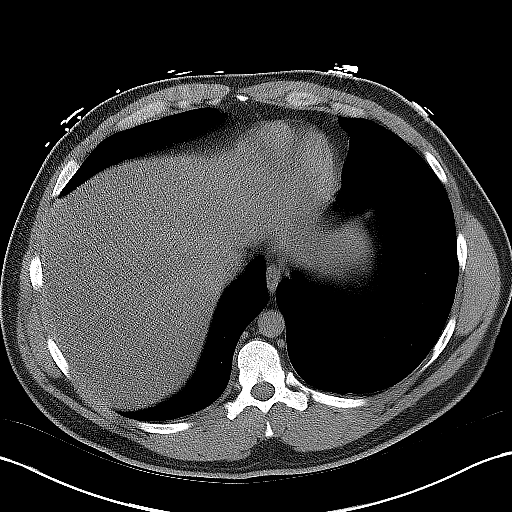
[im 14/28  lung]
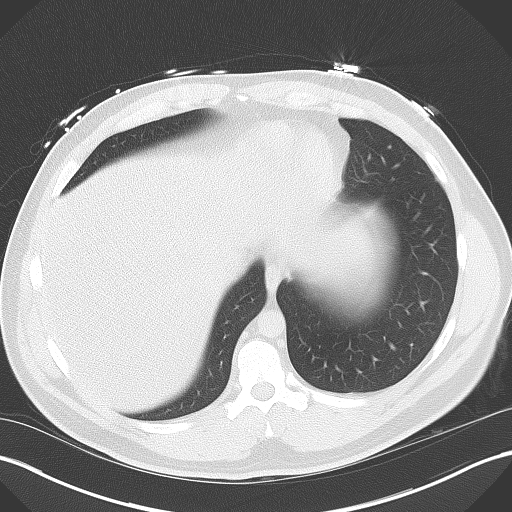
[im 19/28  soft-tissue]
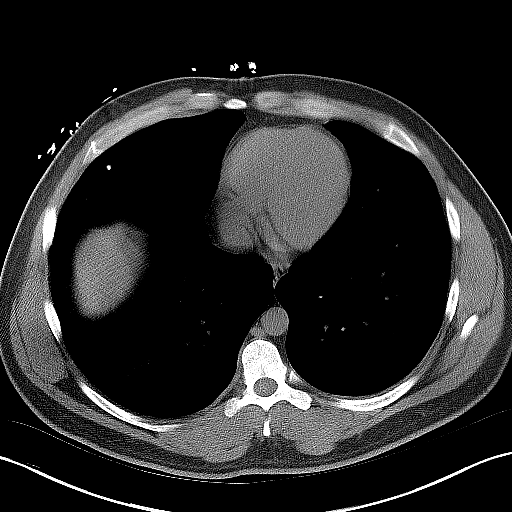
[im 19/28  lung]
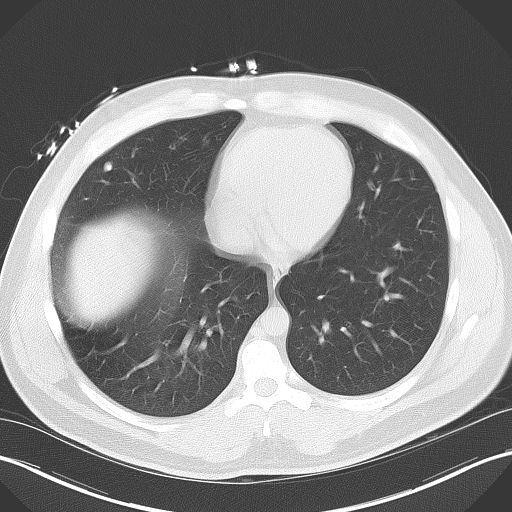
[im 23/28  soft-tissue]
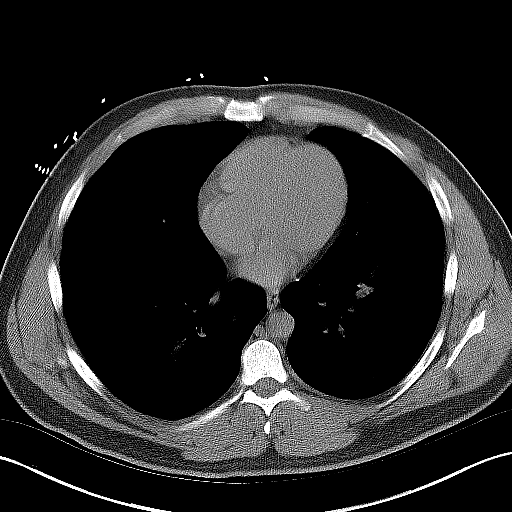
[im 23/28  lung]
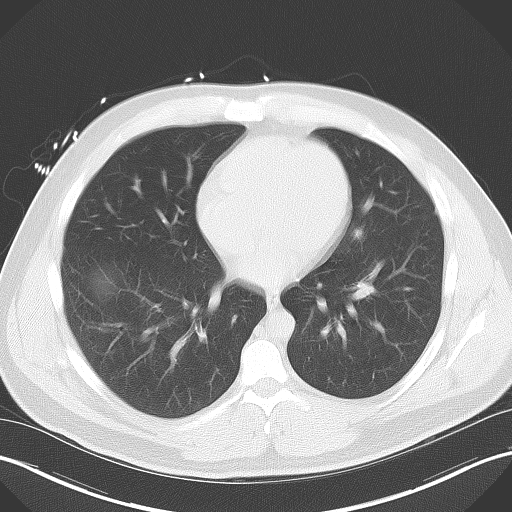

[Series 5: coronal · coronal · 0.76mm/px · 3 of 145 slices shown, 4 images]
[im 49/145  soft-tissue]
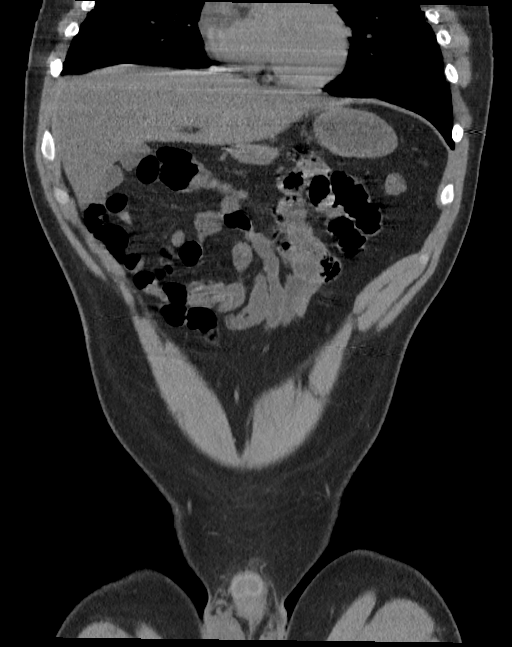
[im 65/145  soft-tissue]
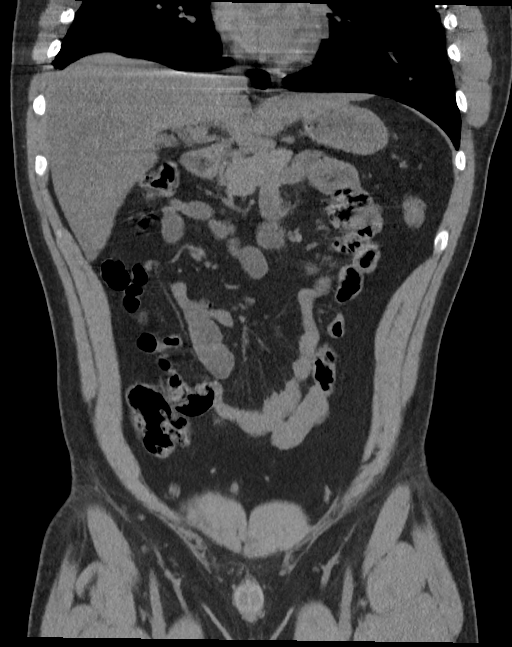
[im 65/145  bone]
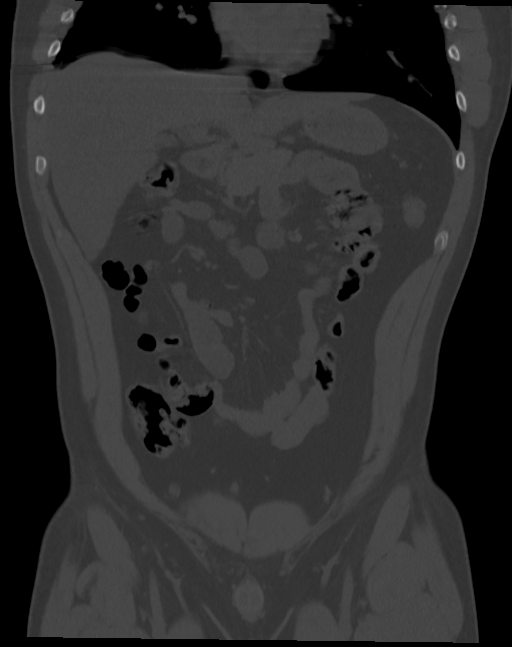
[im 81/145  soft-tissue]
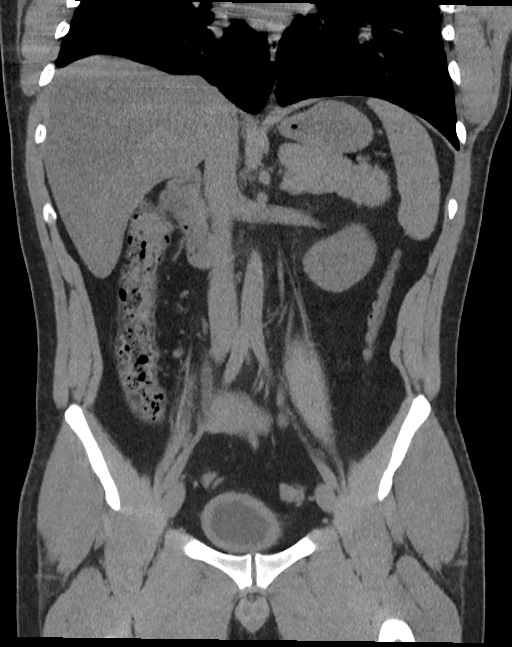

[Series 6: sagittal · sagittal · 0.56mm/px · 1 of 196 slices shown, 2 images]
[im 66/196  soft-tissue]
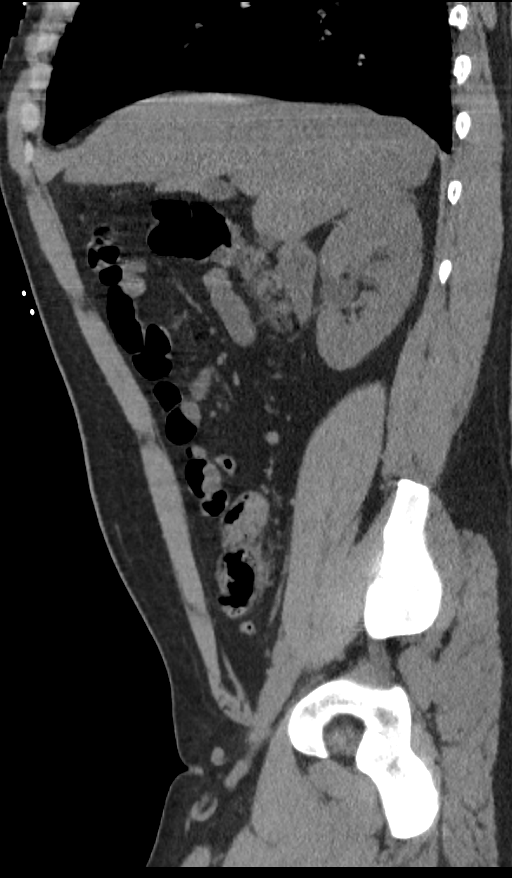
[im 66/196  bone]
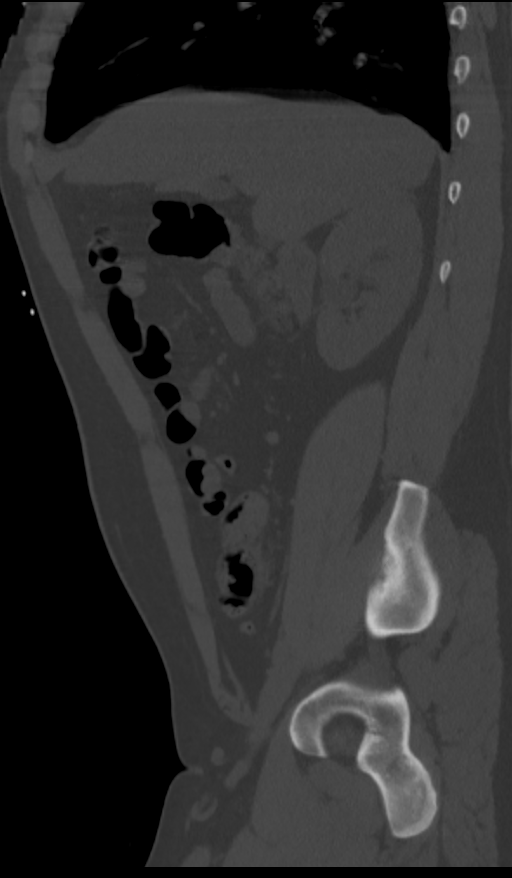

[9 of 46 positions shown; findings below may reference images not displayed]

FINDINGS: Lower chest: No acute abnormality.

Hepatobiliary: Diffuse low density of the liver is identified. No
focal liver lesions noted. The gallbladder is normal. The biliary
tree is normal.

Pancreas: Unremarkable. No pancreatic ductal dilatation or
surrounding inflammatory changes.

Spleen: Normal in size without focal abnormality.

Adrenals/Urinary Tract: The bilateral adrenal glands are normal. The
kidneys are normal. No focal renal lesion is identified. Right
parapelvic renal cyst is noted. There is no nephrolithiasis. No
hydronephrosis is identified bilaterally. The bladder is partial
decompressed with mild diffuse wall thickening.

Stomach/Bowel: Stomach is within normal limits. Appendix appears
normal. No evidence of bowel wall thickening, distention, or
inflammatory changes.

Vascular/Lymphatic: No significant vascular findings are present. No
enlarged abdominal or pelvic lymph nodes.

Reproductive: Prostate is unremarkable.

Other: None.

Musculoskeletal: No acute abnormality.
IMPRESSION: No hydronephrosis or nephrolithiasis bilaterally.

The bladder is decompressed with mild diffuse wall thickening. This
is nonspecific.

Fatty infiltration of liver.

No bowel obstruction is noted.  The appendix is normal.

## 2020-02-07 ENCOUNTER — Other Ambulatory Visit: Payer: Self-pay

## 2020-02-07 DIAGNOSIS — Z20822 Contact with and (suspected) exposure to covid-19: Secondary | ICD-10-CM

## 2020-02-09 LAB — NOVEL CORONAVIRUS, NAA: SARS-CoV-2, NAA: DETECTED — AB

## 2020-02-09 LAB — SARS-COV-2, NAA 2 DAY TAT

## 2020-02-12 ENCOUNTER — Telehealth: Payer: Self-pay | Admitting: *Deleted

## 2020-02-12 NOTE — Telephone Encounter (Signed)
Patient notified of positive COVID-19 test results. Pt verbalized understanding. Pt reports symptoms of cough and cold symptoms. Criteria for self-isolation if you test positive for COVID-19, regardless of vaccination status:  -If you have mild symptoms that are resolving or have resolved, isolate at home for 5 days since symptoms started AND continue to wear a well-fitted mask when around others in the home and in public for 5 additional days after isolation is completed -If you have a fever and/or moderate to severe symptoms, isolate for at least 10 days since the symptoms started AND until you are fever free for at least 24 hours without the use of fever-reducing medications  Use over-the-counter medications for symptoms.If you develop respiratory issues/distress, seek medical care in the Emergency Department.  If you must leave home or if you have to be around others please wear a mask. Please limit contact with immediate family members in the home, practice social distancing, frequent handwashing and clean hard surfaces touched frequently with household cleaning products. Members of your household will also need to quarantine and test.You may also be contacted by the health department for follow up. [facility] Department notified previously.

## 2022-03-29 ENCOUNTER — Other Ambulatory Visit: Payer: Self-pay | Admitting: Nurse Practitioner

## 2022-03-29 DIAGNOSIS — R7401 Elevation of levels of liver transaminase levels: Secondary | ICD-10-CM

## 2022-04-03 ENCOUNTER — Ambulatory Visit
Admission: RE | Admit: 2022-04-03 | Discharge: 2022-04-03 | Disposition: A | Discharge status: Home / Self Care | Payer: Commercial Managed Care - PPO | Source: Ambulatory Visit | Attending: Nurse Practitioner | Admitting: Nurse Practitioner

## 2022-04-03 DIAGNOSIS — R7401 Elevation of levels of liver transaminase levels: Secondary | ICD-10-CM | POA: Diagnosis present

## 2024-01-02 ENCOUNTER — Encounter: Admitting: Dietician

## 2024-01-31 ENCOUNTER — Encounter: Admitting: Dietician
